# Patient Record
Sex: Female | Born: 1965
Health system: Southern US, Community
[De-identification: ages and names within clinical notes are randomized; demographics above are authoritative.]

## PROBLEM LIST (undated history)

## (undated) DIAGNOSIS — H409 Unspecified glaucoma: Secondary | ICD-10-CM

## (undated) DIAGNOSIS — E119 Type 2 diabetes mellitus without complications: Secondary | ICD-10-CM

## (undated) DIAGNOSIS — I10 Essential (primary) hypertension: Secondary | ICD-10-CM

## (undated) DIAGNOSIS — R7302 Impaired glucose tolerance (oral): Secondary | ICD-10-CM

## (undated) DIAGNOSIS — M1990 Unspecified osteoarthritis, unspecified site: Secondary | ICD-10-CM

## (undated) DIAGNOSIS — E669 Obesity, unspecified: Secondary | ICD-10-CM

## (undated) HISTORY — DX: Impaired glucose tolerance (oral): R73.02

## (undated) HISTORY — DX: Obesity, unspecified: E66.9

## (undated) HISTORY — DX: Essential (primary) hypertension: I10

## (undated) HISTORY — DX: Unspecified osteoarthritis, unspecified site: M19.90

## (undated) MED FILL — Medication: Fill #0 | Status: CN

---

## 1997-11-30 ENCOUNTER — Encounter: Admission: RE | Admit: 1997-11-30 | Discharge: 1997-11-30 | Payer: Self-pay | Admitting: Sports Medicine

## 1997-12-28 ENCOUNTER — Encounter: Admission: RE | Admit: 1997-12-28 | Discharge: 1997-12-28 | Payer: Self-pay | Admitting: Sports Medicine

## 1998-03-09 ENCOUNTER — Encounter: Admission: RE | Admit: 1998-03-09 | Discharge: 1998-03-09 | Payer: Self-pay | Admitting: Family Medicine

## 1998-03-15 ENCOUNTER — Encounter: Admission: RE | Admit: 1998-03-15 | Discharge: 1998-03-15 | Payer: Self-pay | Admitting: Sports Medicine

## 1998-03-29 ENCOUNTER — Encounter: Admission: RE | Admit: 1998-03-29 | Discharge: 1998-03-29 | Payer: Self-pay | Admitting: Family Medicine

## 1998-05-06 ENCOUNTER — Encounter: Admission: RE | Admit: 1998-05-06 | Discharge: 1998-05-06 | Payer: Self-pay | Admitting: Family Medicine

## 1998-06-06 ENCOUNTER — Encounter: Admission: RE | Admit: 1998-06-06 | Discharge: 1998-06-06 | Payer: Self-pay | Admitting: Family Medicine

## 1998-06-08 ENCOUNTER — Encounter: Admission: RE | Admit: 1998-06-08 | Discharge: 1998-06-08 | Payer: Self-pay | Admitting: Family Medicine

## 1998-07-06 ENCOUNTER — Encounter: Admission: RE | Admit: 1998-07-06 | Discharge: 1998-07-06 | Payer: Self-pay | Admitting: Family Medicine

## 1998-08-23 ENCOUNTER — Encounter: Admission: RE | Admit: 1998-08-23 | Discharge: 1998-08-23 | Payer: Self-pay | Admitting: Family Medicine

## 1998-09-22 ENCOUNTER — Encounter: Admission: RE | Admit: 1998-09-22 | Discharge: 1998-09-22 | Payer: Self-pay | Admitting: Family Medicine

## 1998-09-26 ENCOUNTER — Encounter: Admission: RE | Admit: 1998-09-26 | Discharge: 1998-09-26 | Payer: Self-pay | Admitting: Family Medicine

## 1999-03-16 ENCOUNTER — Encounter: Admission: RE | Admit: 1999-03-16 | Discharge: 1999-03-16 | Payer: Self-pay | Admitting: Family Medicine

## 1999-07-07 ENCOUNTER — Encounter: Admission: RE | Admit: 1999-07-07 | Discharge: 1999-07-07 | Payer: Self-pay | Admitting: Family Medicine

## 2000-01-17 ENCOUNTER — Encounter: Admission: RE | Admit: 2000-01-17 | Discharge: 2000-01-17 | Payer: Self-pay | Admitting: Family Medicine

## 2000-07-30 ENCOUNTER — Encounter: Admission: RE | Admit: 2000-07-30 | Discharge: 2000-07-30 | Payer: Self-pay | Admitting: Family Medicine

## 2001-03-19 ENCOUNTER — Encounter: Admission: RE | Admit: 2001-03-19 | Discharge: 2001-03-19 | Payer: Self-pay | Admitting: Family Medicine

## 2001-09-04 ENCOUNTER — Encounter: Admission: RE | Admit: 2001-09-04 | Discharge: 2001-09-04 | Payer: Self-pay | Admitting: Family Medicine

## 2002-01-16 ENCOUNTER — Encounter: Admission: RE | Admit: 2002-01-16 | Discharge: 2002-01-16 | Payer: Self-pay | Admitting: Family Medicine

## 2002-04-06 ENCOUNTER — Encounter: Admission: RE | Admit: 2002-04-06 | Discharge: 2002-04-06 | Payer: Self-pay | Admitting: Occupational Medicine

## 2002-04-06 ENCOUNTER — Encounter: Payer: Self-pay | Admitting: Occupational Medicine

## 2002-05-06 ENCOUNTER — Encounter: Admission: RE | Admit: 2002-05-06 | Discharge: 2002-08-04 | Payer: Self-pay | Admitting: Orthopedic Surgery

## 2002-07-01 ENCOUNTER — Encounter: Admission: RE | Admit: 2002-07-01 | Discharge: 2002-07-01 | Payer: Self-pay | Admitting: Family Medicine

## 2003-06-11 ENCOUNTER — Encounter: Admission: RE | Admit: 2003-06-11 | Discharge: 2003-06-11 | Payer: Self-pay | Admitting: Family Medicine

## 2004-08-24 ENCOUNTER — Ambulatory Visit: Payer: Self-pay | Admitting: Family Medicine

## 2005-01-29 ENCOUNTER — Encounter (INDEPENDENT_AMBULATORY_CARE_PROVIDER_SITE_OTHER): Payer: Self-pay | Admitting: *Deleted

## 2005-01-29 LAB — CONVERTED CEMR LAB

## 2005-02-13 ENCOUNTER — Ambulatory Visit: Payer: Self-pay | Admitting: Family Medicine

## 2005-09-05 ENCOUNTER — Emergency Department (HOSPITAL_COMMUNITY): Admission: EM | Admit: 2005-09-05 | Discharge: 2005-09-05 | Payer: Self-pay | Admitting: Family Medicine

## 2006-03-04 ENCOUNTER — Encounter (INDEPENDENT_AMBULATORY_CARE_PROVIDER_SITE_OTHER): Payer: Self-pay | Admitting: Family Medicine

## 2006-03-04 ENCOUNTER — Ambulatory Visit: Payer: Self-pay | Admitting: Sports Medicine

## 2006-03-04 LAB — CONVERTED CEMR LAB
BUN: 14 mg/dL (ref 6–23)
Creatinine, Ser: 0.91 mg/dL (ref 0.40–1.20)
Direct LDL: 100 mg/dL — ABNORMAL HIGH
Glucose, Bld: 105 mg/dL — ABNORMAL HIGH (ref 70–99)
Potassium: 3.7 meq/L (ref 3.5–5.3)

## 2006-03-28 DIAGNOSIS — IMO0002 Reserved for concepts with insufficient information to code with codable children: Secondary | ICD-10-CM | POA: Insufficient documentation

## 2006-03-28 DIAGNOSIS — I10 Essential (primary) hypertension: Secondary | ICD-10-CM | POA: Insufficient documentation

## 2006-03-28 DIAGNOSIS — M1710 Unilateral primary osteoarthritis, unspecified knee: Secondary | ICD-10-CM

## 2006-03-28 DIAGNOSIS — H409 Unspecified glaucoma: Secondary | ICD-10-CM | POA: Insufficient documentation

## 2006-03-29 ENCOUNTER — Encounter (INDEPENDENT_AMBULATORY_CARE_PROVIDER_SITE_OTHER): Payer: Self-pay | Admitting: *Deleted

## 2006-06-20 ENCOUNTER — Emergency Department (HOSPITAL_COMMUNITY): Admission: EM | Admit: 2006-06-20 | Discharge: 2006-06-20 | Payer: Self-pay | Admitting: Emergency Medicine

## 2006-06-27 ENCOUNTER — Encounter (INDEPENDENT_AMBULATORY_CARE_PROVIDER_SITE_OTHER): Payer: Self-pay | Admitting: Family Medicine

## 2006-06-27 ENCOUNTER — Encounter: Admission: RE | Admit: 2006-06-27 | Discharge: 2006-06-27 | Payer: Self-pay | Admitting: Vascular Surgery

## 2006-08-12 ENCOUNTER — Emergency Department (HOSPITAL_COMMUNITY): Admission: EM | Admit: 2006-08-12 | Discharge: 2006-08-12 | Payer: Self-pay | Admitting: Emergency Medicine

## 2007-04-23 ENCOUNTER — Telehealth: Payer: Self-pay | Admitting: *Deleted

## 2007-06-12 ENCOUNTER — Ambulatory Visit: Payer: Self-pay | Admitting: Family Medicine

## 2007-06-26 ENCOUNTER — Ambulatory Visit: Payer: Self-pay | Admitting: Family Medicine

## 2007-07-10 ENCOUNTER — Ambulatory Visit: Payer: Self-pay | Admitting: Family Medicine

## 2007-07-10 ENCOUNTER — Encounter: Admission: RE | Admit: 2007-07-10 | Discharge: 2007-07-10 | Payer: Self-pay | Admitting: Obstetrics and Gynecology

## 2007-08-04 ENCOUNTER — Ambulatory Visit: Payer: Self-pay | Admitting: Family Medicine

## 2007-08-04 DIAGNOSIS — M545 Low back pain, unspecified: Secondary | ICD-10-CM | POA: Insufficient documentation

## 2007-08-04 DIAGNOSIS — M79604 Pain in right leg: Secondary | ICD-10-CM | POA: Insufficient documentation

## 2007-08-11 ENCOUNTER — Ambulatory Visit: Payer: Self-pay | Admitting: Family Medicine

## 2007-08-11 ENCOUNTER — Encounter: Payer: Self-pay | Admitting: Family Medicine

## 2007-08-11 ENCOUNTER — Encounter: Admission: RE | Admit: 2007-08-11 | Discharge: 2007-08-11 | Payer: Self-pay | Admitting: Family Medicine

## 2007-08-11 DIAGNOSIS — M25569 Pain in unspecified knee: Secondary | ICD-10-CM | POA: Insufficient documentation

## 2007-08-11 DIAGNOSIS — R209 Unspecified disturbances of skin sensation: Secondary | ICD-10-CM | POA: Insufficient documentation

## 2007-08-11 DIAGNOSIS — M79609 Pain in unspecified limb: Secondary | ICD-10-CM | POA: Insufficient documentation

## 2007-08-11 LAB — CONVERTED CEMR LAB
ALT: 23 units/L (ref 0–35)
AST: 28 units/L (ref 0–37)
Alkaline Phosphatase: 80 units/L (ref 39–117)
BUN: 19 mg/dL (ref 6–23)
Chloride: 103 meq/L (ref 96–112)
Creatinine, Ser: 0.97 mg/dL (ref 0.40–1.20)
HCT: 42.8 % (ref 36.0–46.0)
Hemoglobin: 14.2 g/dL (ref 12.0–15.0)
MCHC: 33.2 g/dL (ref 30.0–36.0)
Platelets: 238 10*3/uL (ref 150–400)
Potassium: 3.9 meq/L (ref 3.5–5.3)
RDW: 14.1 % (ref 11.5–15.5)

## 2007-08-12 ENCOUNTER — Encounter: Payer: Self-pay | Admitting: Family Medicine

## 2007-09-26 ENCOUNTER — Telehealth: Payer: Self-pay | Admitting: *Deleted

## 2007-11-30 LAB — CONVERTED CEMR LAB
HDL: 50 mg/dL
LDL Cholesterol: 58 mg/dL

## 2007-12-09 ENCOUNTER — Telehealth: Payer: Self-pay | Admitting: *Deleted

## 2008-08-10 ENCOUNTER — Encounter: Admission: RE | Admit: 2008-08-10 | Discharge: 2008-08-10 | Payer: Self-pay | Admitting: Family Medicine

## 2008-08-11 ENCOUNTER — Ambulatory Visit: Payer: Self-pay | Admitting: Family Medicine

## 2008-08-11 DIAGNOSIS — E8881 Metabolic syndrome: Secondary | ICD-10-CM | POA: Insufficient documentation

## 2008-08-12 ENCOUNTER — Encounter: Payer: Self-pay | Admitting: Family Medicine

## 2008-08-12 ENCOUNTER — Ambulatory Visit: Payer: Self-pay | Admitting: Family Medicine

## 2008-08-12 LAB — CONVERTED CEMR LAB
Chloride: 105 meq/L (ref 96–112)
Potassium: 4.3 meq/L (ref 3.5–5.3)
Sodium: 143 meq/L (ref 135–145)

## 2008-10-26 ENCOUNTER — Encounter: Payer: Self-pay | Admitting: Family Medicine

## 2008-10-26 ENCOUNTER — Ambulatory Visit: Payer: Self-pay | Admitting: Family Medicine

## 2009-05-02 ENCOUNTER — Encounter: Payer: Self-pay | Admitting: Family Medicine

## 2009-05-02 ENCOUNTER — Ambulatory Visit: Payer: Self-pay | Admitting: Family Medicine

## 2009-05-02 LAB — CONVERTED CEMR LAB
Albumin: 3.8 g/dL (ref 3.5–5.2)
BUN: 17 mg/dL (ref 6–23)
CO2: 22 meq/L (ref 19–32)
Calcium: 9.3 mg/dL (ref 8.4–10.5)
Chloride: 102 meq/L (ref 96–112)
Glucose, Bld: 104 mg/dL — ABNORMAL HIGH (ref 70–99)
Potassium: 3.8 meq/L (ref 3.5–5.3)
Sodium: 138 meq/L (ref 135–145)
Total Protein: 7.2 g/dL (ref 6.0–8.3)

## 2009-07-28 ENCOUNTER — Ambulatory Visit: Payer: Self-pay | Admitting: Family Medicine

## 2009-10-17 ENCOUNTER — Encounter: Admission: RE | Admit: 2009-10-17 | Discharge: 2009-10-17 | Payer: Self-pay | Admitting: Sports Medicine

## 2009-11-18 ENCOUNTER — Emergency Department (HOSPITAL_COMMUNITY): Admission: EM | Admit: 2009-11-18 | Discharge: 2009-11-18 | Payer: Self-pay | Admitting: Family Medicine

## 2009-11-20 ENCOUNTER — Emergency Department (HOSPITAL_COMMUNITY): Admission: EM | Admit: 2009-11-20 | Discharge: 2009-11-20 | Payer: Self-pay | Admitting: Family Medicine

## 2009-11-23 ENCOUNTER — Encounter: Payer: Self-pay | Admitting: Family Medicine

## 2009-11-23 ENCOUNTER — Ambulatory Visit: Payer: Self-pay | Admitting: Family Medicine

## 2009-11-24 LAB — CONVERTED CEMR LAB
BUN: 25 mg/dL — ABNORMAL HIGH (ref 6–23)
Potassium: 4 meq/L (ref 3.5–5.3)
Sodium: 140 meq/L (ref 135–145)

## 2009-11-30 ENCOUNTER — Ambulatory Visit: Payer: Self-pay | Admitting: Sports Medicine

## 2009-11-30 ENCOUNTER — Encounter (INDEPENDENT_AMBULATORY_CARE_PROVIDER_SITE_OTHER): Payer: Self-pay | Admitting: *Deleted

## 2009-12-07 ENCOUNTER — Encounter
Admission: RE | Admit: 2009-12-07 | Discharge: 2010-01-18 | Payer: Self-pay | Source: Home / Self Care | Attending: Family Medicine | Admitting: Family Medicine

## 2009-12-09 ENCOUNTER — Encounter: Payer: Self-pay | Admitting: Family Medicine

## 2009-12-12 ENCOUNTER — Encounter (INDEPENDENT_AMBULATORY_CARE_PROVIDER_SITE_OTHER): Payer: Self-pay | Admitting: *Deleted

## 2010-02-20 ENCOUNTER — Encounter: Payer: Self-pay | Admitting: Family Medicine

## 2010-02-28 NOTE — Assessment & Plan Note (Signed)
Summary: F/U PINCHED NERVE PAIN & BP/[facility]   Vital Signs:  Patient profile:   45 year old female Height:      68.5 inches Weight:      327 pounds Temp:     97.9 degrees F oral Pulse rate:   71 / minute BP sitting:   152 / 91  (left arm) Cuff size:   large  Vitals Entered By: Jimmy Footman, CMA (November 23, 2009 3:18 PM)  Serial Vital Signs/Assessments:  Time      Position  BP       Pulse  Resp  Temp     By                     142/94                         Lloyd Huger MD  CC: f/u pinched nerve/ bp Is Patient Diabetic? Yes Pain Assessment Patient in pain? yes       9  Primary Provider:  Barnabas Lister  MD  CC:  f/u pinched nerve/ bp.  History of Present Illness: HTN: Patient presents due to hypertension measured at 180/110 today at her employee health assessment. She has had high blood pressure since an acute on chronic worsening of her back pain. Measurement taken at UC 5 days ago was 179/99. Home readings are 169/92, 145/84 (sunday with improvement in pain). Denies HA, vision changes, chest pain, exertional dyspnea.   Back Pain: Seen at [facility] for radiculopathy and treated with steroid taper, back injection?, and has follow up scheduled with sports med next tuesday. Her pain was previously 10/10 prior to urgent care visit, and then started improving 3 days ago with the pain regimen (hydrocodone/apap, diclofenac, prednisone, amitryptilene, flexeril). Her pain is currently 1/10.   Preventive Screening-Counseling & Management  Alcohol-Tobacco     Smoking Status: never ` Allergies: No Known Drug Allergies  Past History:  Past Medical History: Last updated: 08/11/2007 creatinine 0.9 (01/2005), 0.9 (03/2006), GYN:  G0, no hx abn paps, Hgb, TSH WNL 5/05, Trich: 05/2003, 01/2005 Lumbar DJD L4-L5, L5-S1 Bilateral knee tricompartmental DJD  Family History: Last updated: 04/12/2006 Father - DM, died of a brain aneursym, MGM - stroke, Mother - HTN, glaucoma, cataracts, no  h/o Ca, CAD, PGM -DM  Social History: Last updated: 2006/04/12 neg tob, EtOH, drugs;; is working to lose weight with diet and exercise;; Married to recovering alcoholic, whom has chronic seizures;; works full time in [employer] and part-time at [employer].  Risk Factors: Alcohol Use: 0 (06/12/2007) Caffeine Use: 0 (06/12/2007) Exercise: no (10/26/2008)  Risk Factors: Smoking Status: never (11/23/2009) Passive Smoke Exposure: yes (06/12/2007)  Review of Systems  The patient denies anorexia, fever, weight loss, weight gain, vision loss, chest pain, syncope, dyspnea on exertion, peripheral edema, prolonged cough, headaches, abdominal pain, muscle weakness, and difficulty walking.    Physical Exam  General:  Well-developed,well-nourished,in no acute distress; alert,appropriate and cooperative throughout examination Head:  Normocephalic and atraumatic without obvious abnormalities. No apparent alopecia or balding. Eyes:  No corneal or conjunctival inflammation noted. EOMI. Perrla. Funduscopic exam benign, without hemorrhages, exudates or papilledema. Vision grossly normal. Mouth:  Oral mucosa and oropharynx without lesions or exudates.  Teeth in good repair. Lungs:  Normal respiratory effort, chest expands symmetrically. Lungs are clear to auscultation, no crackles or wheezes. Heart:  Normal rate and regular rhythm. S1 and S2 normal without gallop, murmur, click,  rub or other extra sounds. Msk:  No deformity or scoliosis noted of thoracic or lumbar spine.   Extremities:  No clubbing, cyanosis, edema, or deformity noted with normal full range of motion of all joints.   Neurologic:  alert & oriented X3, cranial nerves II-XII intact, strength normal in all extremities, and sensation intact to light touch.     Impression & Recommendations:  Problem # 1:  HYPERTENSION, BENIGN SYSTEMIC (ICD-401.1) Acute on chronic hypertension likely secondary to severe back pain. Likely somewhat elevated due  to automated machine taking her BP with inappropriate sized cuff. Measured in office with manual cuff to be 142/94 on repeat. Will not add any new medications today and defer to PCP for chronic managment. Checked labs today and asked pt to record daily BP to show Dr. Tye Savoy when she follows up in one month.   Her updated medication list for this problem includes:    Toprol Xl 200 Mg Xr24h-tab (Metoprolol succinate) ..... One tab by mouth daily    Lisinopril-hydrochlorothiazide 20-25 Mg Tabs (Lisinopril-hydrochlorothiazide) ..... One tab by mouth daily  Orders: Basic Met-[facility] ([address]-60454) [facility]- Est Level  3 (09811)  Problem # 2:  LEG PAIN, RIGHT (ICD-729.5)  Pain is controlled with urgent care physician's pain managment. She states amitryptilene has been very beneficial in the past, and this may help with her chronic pain. Also discussed importance of continued weight loss.   Orders: [facility]- Est Level  3 (91478)  Problem # 3:  OBESITY, MORBID (ICD-278.01) Has lost 7 pounds since last visit. Encouraged patient to continue trying.   Complete Medication List: 1)  Toprol Xl 200 Mg Xr24h-tab (Metoprolol succinate) .... One tab by mouth daily 2)  Lumigan 0.03 % Soln (Bimatoprost) 3)  Amitriptyline Hcl 75 Mg Tabs (Amitriptyline hcl) .... One daily 4)  Lisinopril-hydrochlorothiazide 20-25 Mg Tabs (Lisinopril-hydrochlorothiazide) .... One tab by mouth daily 5)  Black Cohosh 540 Mg Caps (Black cohosh) 6)  Calcium 500-125 Mg-unit Tabs (Calcium carbonate-vitamin d) 7)  Multivitamins Caps (Multiple vitamin)  Patient Instructions: 1)  Please schedule appointment with Dr. Tye Savoy to talk about your blood pressure. 2)  Take your BP every day and write down results to show Dr. Tye Savoy. 3)  Good job on weight loss. Keep up the good work!   Orders Added: 1)  Basic Met-[facility] [29562-13086] 2)  [facility]- Est Level  3 [57846]

## 2010-02-28 NOTE — Assessment & Plan Note (Signed)
Summary: f/up,tcb   Vital Signs:  Patient profile:   45 year old female Height:      68.5 inches Weight:      332 pounds BMI:     49.93 Temp:     98.3 degrees F BP sitting:   147 / 83  (left arm)  Vitals Entered By: Theresia Lo RN (May 02, 2009 8:32 AM)  Nutrition Counseling: Patient's BMI is greater than 25 and therefore counseled on weight management options. CC: BP was up at work on Friday Is Patient Diabetic? No Pain Assessment Patient in pain? no        Primary Care Provider:  Lequita Asal  MD  CC:  BP was up at work on Friday.  History of Present Illness: 45 y/o morbidly obese female here for f/u HTN  HTN- BPs at work 160s/100s upon checking at work. denies chest pain, SOB, edema, palpitations, headaches. on toprol and lisinopril/hctz  impaired glucose tolerance- A1C 6.1 last July. denies polyuria, polydipsia. +Family H/o DM.  Habits & Providers  Alcohol-Tobacco-Diet     Tobacco Status: never  Current Medications (verified): 1)  Toprol Xl 200 Mg Xr24h-Tab (Metoprolol Succinate) .... One Tab By Mouth Daily 2)  Lumigan 0.03 % Soln (Bimatoprost) 3)  Amitriptyline Hcl 75 Mg  Tabs (Amitriptyline Hcl) .... One Daily 4)  Lisinopril-Hydrochlorothiazide 20-25 Mg  Tabs (Lisinopril-Hydrochlorothiazide) .... One Tab By Mouth Daily 5)  Black Cohosh 540 Mg Caps (Black Cohosh) 6)  Calcium 500-125 Mg-Unit Tabs (Calcium Carbonate-Vitamin D) 7)  Multivitamins  Caps (Multiple Vitamin)  Allergies (verified): No Known Drug Allergies  Past History:  Family history reviewed for relevance to current acute and chronic problems.  Family History: Reviewed history from 03/28/2006 and no changes required. Father - DM, died of a brain aneursym, MGM - stroke, Mother - HTN, glaucoma, cataracts, no h/o Ca, CAD, PGM -DM  Physical Exam  General:  alert, morbidly obese. vitals reviewed. Mouth:  Oral mucosa and oropharynx without lesions or exudates.  Teeth in good  repair. Neck:  No deformities, masses, or tenderness noted. no carotid bruits.  Lungs:  Normal respiratory effort, chest expands symmetrically. Lungs are clear to auscultation, no crackles or wheezes. Heart:  Normal rate and regular rhythm. S1 and S2 normal without gallop, murmur, click, rub or other extra sounds. Pulses:  R and L carotid,radial,dorsalis pedis and posterior tibial pulses are full and equal bilaterally Extremities:  no  LE edema bilaterally   Impression & Recommendations:  Problem # 1:  HYPERTENSION, BENIGN SYSTEMIC (ICD-401.1) Assessment Unchanged BP near goal in office today. patient counseled to avoid foods high in sodium. no changes for now. patient to monitor BP at work qweek x1 month.   Her updated medication list for this problem includes:    Toprol Xl 200 Mg Xr24h-tab (Metoprolol succinate) ..... One tab by mouth daily    Lisinopril-hydrochlorothiazide 20-25 Mg Tabs (Lisinopril-hydrochlorothiazide) ..... One tab by mouth daily  Orders: Comp Met-[facility] ([address]109-60454) [facility]- Est Level  3 (09811)  Problem # 2:  IMPAIRED GLUCOSE TOLERANCE (ICD-271.3) Assessment: Unchanged check A1C.   Orders: A1C-[facility] (91478) [facility]- Est Level  3 (29562)   Prevention & Chronic Care Immunizations   Influenza vaccine: Not documented   Influenza vaccine deferral: Not available  (08/11/2008)    Tetanus booster: 12/30/1998: Done.   Tetanus booster due: 12/29/2008    Pneumococcal vaccine: Not documented   Pneumococcal vaccine deferral: Not indicated  (08/11/2008)  Other Screening   Pap smear: NEGATIVE FOR INTRAEPITHELIAL  LESIONS OR MALIGNANCY.  (10/26/2008)   Pap smear action/deferral: Ordered  (10/26/2008)   Pap smear due: 01/29/2006    Mammogram: ASSESSMENT: Negative - BI-RADS 1^MM DIGITAL SCREENING  (08/10/2008)   Mammogram action/deferral: Screening mammogram in 1 year.     (06/27/2006)   Mammogram due: 08/10/2009   Smoking status: never  (05/02/2009)  Lipids   Total  Cholesterol: Not documented   LDL: 58  (11/30/2007)   LDL Direct: 100  (03/04/2006)   HDL: 50  (11/30/2007)   Triglycerides: 87  (11/30/2007)  Hypertension   Last Blood Pressure: 147 / 83  (05/02/2009)   Serum creatinine: 0.91  (08/12/2008)   Serum potassium 4.3  (08/12/2008) CMP ordered     Hypertension flowsheet reviewed?: Yes   Progress toward BP goal: Improved  Self-Management Support :   Personal Goals (by the next clinic visit) :      Personal blood pressure goal: 140/90  (08/11/2008)   Patient will work on the following items until the next clinic visit to reach self-care goals:     Medications and monitoring: take my medicines every day, check my blood pressure  (05/02/2009)     Eating: eat more vegetables, eat foods that are low in salt  (05/02/2009)     Activity: take a 30 minute walk every day  (05/02/2009)    Hypertension self-management support: BP self-monitoring log, Written self-care plan, Education handout  (05/02/2009)   Hypertension self-care plan printed.   Hypertension education handout printed  Appended Document: A1c  5.9%    Lab Visit  Laboratory Results   Blood Tests   Date/Time Received: May 02, 2009 9:15 AM  Date/Time Reported: May 02, 2009 11:36 AM   HGBA1C: 5.9%   (Normal Range: Non-Diabetic - 3-6%   Control Diabetic - 6-8%)  Comments: ...............test performed by......Marland KitchenBonnie A. [place], MLS (ASCP)cm    Orders Today:

## 2010-02-28 NOTE — Assessment & Plan Note (Signed)
Summary: F/U VISIT/[facility]   Vital Signs:  Patient profile:   45 year old female Weight:      334.5 pounds Temp:     98.2 degrees F oral Pulse rate:   90 / minute Pulse rhythm:   regular BP sitting:   139 / 81  (left arm) Cuff size:   large  Vitals Entered By: Loralee Pacas CMA (July 28, 2009 4:03 PM)  Primary Care Provider:  Lequita Asal  MD  CC:  f/u BP.  History of Present Illness: HTN- hasnt been taking BP at work. denies chest pain, SOB, edema, palpitations, headaches. on toprol and lisinopril/hctz. has had some dietary indiscretions.  Current Medications (verified): 1)  Toprol Xl 200 Mg Xr24h-Tab (Metoprolol Succinate) .... One Tab By Mouth Daily 2)  Lumigan 0.03 % Soln (Bimatoprost) 3)  Amitriptyline Hcl 75 Mg  Tabs (Amitriptyline Hcl) .... One Daily 4)  Lisinopril-Hydrochlorothiazide 20-25 Mg  Tabs (Lisinopril-Hydrochlorothiazide) .... One Tab By Mouth Daily 5)  Black Cohosh 540 Mg Caps (Black Cohosh) 6)  Calcium 500-125 Mg-Unit Tabs (Calcium Carbonate-Vitamin D) 7)  Multivitamins  Caps (Multiple Vitamin)  Allergies (verified): No Known Drug Allergies  Physical Exam  General:  alert, morbidly obese. vitals reviewed. Mouth:  MMM   Impression & Recommendations:  Problem # 1:  HYPERTENSION, BENIGN SYSTEMIC (ICD-401.1) Assessment Unchanged  no changes. f/u in 6 months.   Her updated medication list for this problem includes:    Toprol Xl 200 Mg Xr24h-tab (Metoprolol succinate) ..... One tab by mouth daily    Lisinopril-hydrochlorothiazide 20-25 Mg Tabs (Lisinopril-hydrochlorothiazide) ..... One tab by mouth daily  Orders: [facility]- Est Level  2 (32992)  Patient Instructions: 1)  follow up in 6 months with Dr. Tye Savoy Prescriptions: LISINOPRIL-HYDROCHLOROTHIAZIDE 20-25 MG  TABS (LISINOPRIL-HYDROCHLOROTHIAZIDE) one tab by mouth daily  #90 Tablet x 4   Entered and Authorized by:   Lequita Asal  MD   Signed by:   Lequita Asal  MD on 07/28/2009  Method used:   Electronically to        [facility]* (retail)       [address]       [address] [place] [address]       [place], [place]  [address]       Ph: [phone]       Fax: [phone]   RxID:   [id number] TOPROL XL 200 MG XR24H-TAB (METOPROLOL SUCCINATE) one tab by mouth daily  #90 x 4   Entered and Authorized by:   Lequita Asal  MD   Signed by:   Lequita Asal  MD on 07/28/2009   Method used:   Electronically to        [facility]* (retail)       [address]       [address] [place] [address]mail[address]       [place], [place]  [address]       Ph: [phone]       Fax: [phone]   RxID:   [id number]

## 2010-02-28 NOTE — Letter (Signed)
Summary: [facility] PT Referral form  [facility] PT Referral form   Imported By: Marily Memos 11/30/2009 15:44:32  _____________________________________________________________________  External Attachment:    Type:   Image     Comment:   External Document

## 2010-02-28 NOTE — Miscellaneous (Signed)
  Clinical Lists Changes  Problems: Removed problem of PERIMENOPAUSAL SYNDROME (ICD-627.9)

## 2010-02-28 NOTE — Assessment & Plan Note (Signed)
Summary: NP,PINCHED NERVE LEG,HIP PAIN,[facility]   Vital Signs:  Patient profile:   45 year old female Height:      68.5 inches Weight:      327 pounds BP sitting:   137 / 90  (left arm)  Vitals Entered By: Rochele Pages RN (November 30, 2009 2:33 PM) CC: rt lower back pain, radiating to shin   Primary Provider:  Barnabas Lister  MD  CC:  rt lower back pain and radiating to shin.  History of Present Illness: 45 yo F here for shin, thigh, and some low back pain x 2 weeks.  Had similar pain in lateral ankle with associated paresthesias 2 years ago that grdaully got better, was told it was a pinched nerve. 2 weeks ago was placing some ice up on a shelf and thinks that triggered things. Shin pain described as numb, hot/cold feeling, and some pins and needles.  Better with walking, in seated position is actually worse.  Does get occasional pain in Rt low back.  Has some radiation from thigh down into her front of her shin. Thinks sometimes her leg gives out. Occasionally feels grinding in her hip. Has tried flexeril, vicodin, pred burst, and 1 amitriptyline 75 mg (which is 45 years old leftover from prior ankle issue).  Also toradol shot.  Given diclofenac which helped the most so far. Has been seen in [facility] clinic and urgent care. Nl B/B function. No saddle anesthesia. States bending back and touching her toes are ok. Has tried exercise bike and stretching, but still with pain. Has pain at night, difficulty laying down for bed. Has normal motor function. 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  Allergies: No Known Drug Allergies  Physical Exam  General:  overweight-appearing.   Msk:  Back: no midline ttp, no paraspinal spasm, no SI ttp.  Neg SLR and FABER b/l.  No pain with back ext, stork, or forward flexion. Hip: FROM, + ttp over hip flexor and upper thigh.  + pain with resisted hip flexionNeg ttp over greater troch Knee: FROM, no eff or crep Lower leg: mild ttp lateral  aspect, SILT Neuro: 1+ patellar and ankle DTRs symmetrical, nl LE strength diffusely b/l   Impression & Recommendations:  Problem # 1:  LEG PAIN, RIGHT (ICD-729.5) I think her lower leg pain is actually lumar radiculopathy, and hip/thigh pain may be hip flexor tendinitis vs hip OA. - upon record review, she has had this pain intermittently for last 2 years - in addition, she has had b/l knee xrays which do show some OA, which could also be contributing. - diclofenac refilled since decreased pain the most - PT referral for hip/thigh for [place], ionto, and phono as well as stretches - f/u 3-4 weeks.  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  Xrays reviewed which show facet arthritis in L4-L5-S1 region and mild listhesis at L5-S1 - diclofenac and amitriptyline refilled - PT referral to concentrate on McKenzie exercises - f/u 3-4 weeks, if no improvement may consider lumbar MRI, but less concerning since she has had this intermittently for so long with normal neurologic function  I spent > 25 minutes with patient, over 50% spent reviewing films and on counseling regarding diagnosis, prognosis, and treatment  Her updated medication list for this problem includes:  Diclofenac Sodium 75 Mg Tbec (Diclofenac sodium) .Marland Kitchen... 1 by mouth two times a day with food  Complete Medication List: 1)  Toprol Xl 200 Mg Xr24h-tab (Metoprolol succinate) .... One tab by mouth daily 2)  Lumigan 0.03 % Soln (Bimatoprost) 3)  Lisinopril-hydrochlorothiazide 20-25 Mg Tabs (Lisinopril-hydrochlorothiazide) .... One tab by mouth daily 4)  Black Cohosh 540 Mg Caps (Black cohosh) 5)  Calcium 500-125 Mg-unit Tabs (Calcium carbonate-vitamin d) 6)  Multivitamins Caps (Multiple vitamin) 7)  Amitriptyline Hcl 50 Mg Tabs (Amitriptyline hcl) ....  Take 1 tab by mouth at bedtime 8)  Diclofenac Sodium 75 Mg Tbec (Diclofenac sodium) .Marland Kitchen.. 1 by mouth two times a day with food Prescriptions: DICLOFENAC SODIUM 75 MG TBEC (DICLOFENAC SODIUM) 1 by mouth two times a day with food  #60 x 0   Entered by:   Lillia Pauls CMA   Authorized by:   Corbin Ade MD   Signed by:   Lillia Pauls CMA on 11/30/2009   Method used:   Electronically to        [facility]* (retail)       [address]       [address] Shipping/mailing       [place], [place]  [address]       Ph: [phone]       Fax: [phone]   RxID:   [id number] AMITRIPTYLINE HCL 50 MG TABS (AMITRIPTYLINE HCL) Take 1 tab by mouth at bedtime  #30 x 1   Entered by:   Lillia Pauls CMA   Authorized by:   Corbin Ade MD   Signed by:   Lillia Pauls CMA on 11/30/2009   Method used:   Electronically to        [facility]* (retail)       [address]       [address] Shipping/mailing       [place], [place]  [address]       Ph: [phone]       Fax: [phone]   RxID:   [id number]    Orders Added: 1)  Est. Patient Level IV [73220]

## 2010-02-28 NOTE — Miscellaneous (Signed)
Summary: Rehab Report  Rehab Report   Imported By: Marily Memos 12/19/2009 08:50:13  _____________________________________________________________________  External Attachment:    Type:   Image     Comment:   External Document

## 2010-08-14 ENCOUNTER — Ambulatory Visit (INDEPENDENT_AMBULATORY_CARE_PROVIDER_SITE_OTHER): Payer: 59 | Admitting: Family Medicine

## 2010-08-14 ENCOUNTER — Encounter: Payer: Self-pay | Admitting: Family Medicine

## 2010-08-14 DIAGNOSIS — IMO0002 Reserved for concepts with insufficient information to code with codable children: Secondary | ICD-10-CM

## 2010-08-14 DIAGNOSIS — E739 Lactose intolerance, unspecified: Secondary | ICD-10-CM

## 2010-08-14 DIAGNOSIS — E8881 Metabolic syndrome: Secondary | ICD-10-CM

## 2010-08-14 DIAGNOSIS — I10 Essential (primary) hypertension: Secondary | ICD-10-CM

## 2010-08-14 LAB — POCT GLYCOSYLATED HEMOGLOBIN (HGB A1C): Hemoglobin A1C: 6.2

## 2010-08-14 LAB — LDL CHOLESTEROL, DIRECT: Direct LDL: 114 mg/dL — ABNORMAL HIGH

## 2010-08-14 MED ORDER — LISINOPRIL-HYDROCHLOROTHIAZIDE 20-25 MG PO TABS: 1.0000 | ORAL_TABLET | Freq: Every day | ORAL | Status: DC

## 2010-08-14 MED ORDER — AMLODIPINE BESYLATE 10 MG PO TABS: 5.0000 mg | ORAL_TABLET | Freq: Every day | ORAL | Status: DC

## 2010-08-14 MED ORDER — METOPROLOL SUCCINATE ER 200 MG PO TB24: 200.0000 mg | ORAL_TABLET | Freq: Every day | ORAL | Status: DC

## 2010-08-14 NOTE — Patient Instructions (Addendum)
Nice to meet you. I will call you if your lab tests abnormal. I will refill your medications. Please start norvasc 5mg  daily. If ok, increase to 10mg  daily after 3 days. Call for physical exam appointment.  Hypertension (High Blood Pressure) As your heart beats, it forces blood through your arteries. This force is your blood pressure. If the pressure is too high, it is called hypertension (HTN) or high blood pressure. HTN is dangerous because you may have it and not know it. High blood pressure may mean that your heart has to work harder to pump blood. Your arteries may be narrow or stiff. The extra work puts you at risk for heart disease, stroke, and other problems.  Blood pressure consists of two numbers, a higher number over a lower, 110/72, for example. It is stated as "110 over 72." The ideal is below 120 for the top number (systolic) and under 80 for the bottom (diastolic). Write down your blood pressure today. You should pay close attention to your blood pressure if you have certain conditions such as:  Heart failure.  Prior heart attack.   Diabetes   Chronic kidney disease.   Prior stroke.   Multiple risk factors for heart disease.   To see if you have HTN, your blood pressure should be measured while you are seated with your arm held at the level of the heart. It should be measured at least twice. A one-time elevated blood pressure reading (especially in the Emergency Department) does not mean that you need treatment. There may be conditions in which the blood pressure is different between your right and left arms. It is important to see your caregiver soon for a recheck. Most people have essential hypertension which means that there is not a specific cause. This type of high blood pressure may be lowered by changing lifestyle factors such as:  Stress.  Smoking.   Lack of exercise.   Excessive weight.  Drug/tobacco/alcohol use.   Eating less salt.   Most people do not have  symptoms from high blood pressure until it has caused damage to the body. Effective treatment can often prevent, delay or reduce that damage. TREATMENT Treatment for high blood pressure, when a cause has been identified, is directed at the cause. There are a large number of medications to treat HTN. These fall into several categories, and your caregiver will help you select the medicines that are best for you. Medications may have side effects. You should review side effects with your caregiver. If your blood pressure stays high after you have made lifestyle changes or started on medicines,   Your medication(s) may need to be changed.   Other problems may need to be addressed.   Be certain you understand your prescriptions, and know how and when to take your medicine.   Be sure to follow up with your caregiver within the time frame advised (usually within two weeks) to have your blood pressure rechecked and to review your medications.   If you are taking more than one medicine to lower your blood pressure, make sure you know how and at what times they should be taken. Taking two medicines at the same time can result in blood pressure that is too low.  SEEK IMMEDIATE MEDICAL CARE IF YOU DEVELOP:  A severe headache, blurred or changing vision, or confusion.   Unusual weakness or numbness, or a faint feeling.   Severe chest or abdominal pain, vomiting, or breathing problems.  MAKE SURE YOU:  Understand these instructions.   Will watch your condition.   Will get help right away if you are not doing well or get worse.  Document Released: 01/15/2005 Document Re-Released: 07/05/2009 [facility] Patient Information 2011 [place], [place].

## 2010-08-15 ENCOUNTER — Encounter: Payer: Self-pay | Admitting: Family Medicine

## 2010-08-15 LAB — CBC
Hemoglobin: 14.1 g/dL (ref 12.0–15.0)
MCV: 94.6 fL (ref 78.0–100.0)
Platelets: 219 10*3/uL (ref 150–400)
RBC: 4.59 MIL/uL (ref 3.87–5.11)
WBC: 7.6 10*3/uL (ref 4.0–10.5)

## 2010-08-15 LAB — BASIC METABOLIC PANEL
BUN: 20 mg/dL (ref 6–23)
Potassium: 4 mEq/L (ref 3.5–5.3)
Sodium: 141 mEq/L (ref 135–145)

## 2010-08-15 LAB — TSH: TSH: 1.388 u[IU]/mL (ref 0.350–4.500)

## 2010-08-15 NOTE — Assessment & Plan Note (Addendum)
Deteriorated. Patient has done nutrition counseling and admits that motivation is primary obstacle currently. Discussed health as motivation, mainly in regards to improving back pain and decreasing number of medications she takes for hypertension.

## 2010-08-15 NOTE — Progress Notes (Signed)
  Subjective:    Patient ID: Misty Maxwell, female    DOB: Apr 01, 1965, 45 y.o.   MRN: [id number]  HPI 1. HTN. Takes BP at home and this week usually 130s/70s. Highest values were during acute pain after diagnosed with radiculopathy in low back ranging sys 170s. Needs med refills today. Denies any syncope, dizzyness. Has gained 10 lb in past few months due to not exercising and eating freely.   2. Radiculopathy. Had significant pain from low back to right thigh, now improved. Still causes intermittent pain and limits her exercise capability at times, but knows she cannot let this limit her activity level in the long term. Denies bowel/bladder incontinence, difficulty walking, weakness. Pt verbalizes that weight worsens this pain.  Review of Systems See HPI, otherwise negative.    Objective:   Physical Exam  Vitals reviewed. Constitutional: She is oriented to person, place, and time. She appears well-developed and well-nourished. No distress.  HENT:  Head: Normocephalic and atraumatic.  Mouth/Throat: Oropharynx is clear and moist.  Eyes: EOM are normal. Pupils are equal, round, and reactive to light.  Cardiovascular: Normal rate and regular rhythm.   No murmur heard.      Distant heart sounds.  Pulmonary/Chest: Effort normal and breath sounds normal. No respiratory distress. She has no wheezes.  Musculoskeletal: Normal range of motion. She exhibits no tenderness.       5/5 distal LE strength symmetric. Trace BLE edema.   Neurological: She is alert and oriented to person, place, and time. Coordination normal.  Psychiatric: She has a normal mood and affect.          Assessment & Plan:

## 2010-08-15 NOTE — Assessment & Plan Note (Signed)
Last A1c 6.1 years ago. With deteriorated obesity, will recheck A1c today. Discussed lifestyle modifications: weight loss and physical activity today.

## 2010-08-15 NOTE — Assessment & Plan Note (Addendum)
Check LDL, A1C to evaluate risk factors and possible need for additional therapy. Goal LDL <130.

## 2010-08-15 NOTE — Assessment & Plan Note (Addendum)
Pain manageable and stable currently. No red flag symptoms. Will hold on refilling flexeril as patient does not feel strongly regarding benefit. Not taking elavil on med list and doesn't know what this medication is for. Will DC and restart if pain becomes more of an issue. PATP.

## 2010-08-15 NOTE — Assessment & Plan Note (Signed)
Pt endorses better values at home, but question accuracy of cuff given her arm size/shape. Consistently >140/90 in clinic. Will start low dose norvasc and titrate to 10mg  if tolerated. F/u in one month. Continue lisinopril/HCTZ and metoprolol at current doses. Labs today.

## 2010-09-21 ENCOUNTER — Other Ambulatory Visit: Payer: Self-pay | Admitting: Family Medicine

## 2010-09-21 DIAGNOSIS — Z1231 Encounter for screening mammogram for malignant neoplasm of breast: Secondary | ICD-10-CM

## 2010-10-19 ENCOUNTER — Ambulatory Visit: Payer: 59

## 2010-10-26 ENCOUNTER — Ambulatory Visit
Admission: RE | Admit: 2010-10-26 | Discharge: 2010-10-26 | Disposition: A | Discharge status: Home / Self Care | Payer: 59 | Source: Ambulatory Visit | Attending: Family Medicine | Admitting: Family Medicine

## 2010-10-26 DIAGNOSIS — Z1231 Encounter for screening mammogram for malignant neoplasm of breast: Secondary | ICD-10-CM

## 2010-10-27 ENCOUNTER — Telehealth: Payer: Self-pay | Admitting: Family Medicine

## 2010-10-27 NOTE — Telephone Encounter (Signed)
States she is having swelling in feet and legs because of the amlodipine.  She is going to come today to have BP checked.

## 2010-11-17 ENCOUNTER — Ambulatory Visit (INDEPENDENT_AMBULATORY_CARE_PROVIDER_SITE_OTHER): Payer: 59 | Admitting: *Deleted

## 2010-11-17 ENCOUNTER — Ambulatory Visit (INDEPENDENT_AMBULATORY_CARE_PROVIDER_SITE_OTHER): Payer: 59 | Admitting: Family Medicine

## 2010-11-17 ENCOUNTER — Ambulatory Visit (HOSPITAL_COMMUNITY)
Admission: RE | Admit: 2010-11-17 | Discharge: 2010-11-17 | Disposition: A | Discharge status: Home / Self Care | Payer: 59 | Source: Ambulatory Visit | Attending: Family Medicine | Admitting: Family Medicine

## 2010-11-17 ENCOUNTER — Encounter: Payer: Self-pay | Admitting: Family Medicine

## 2010-11-17 VITALS — BP 150/96 | Temp 98.3°F | Ht 68.0 in | Wt 345.5 lb

## 2010-11-17 VITALS — BP 148/94 | HR 84 | Temp 98.3°F | Ht 68.0 in | Wt 345.5 lb

## 2010-11-17 DIAGNOSIS — M79609 Pain in unspecified limb: Secondary | ICD-10-CM

## 2010-11-17 DIAGNOSIS — R609 Edema, unspecified: Secondary | ICD-10-CM

## 2010-11-17 DIAGNOSIS — M7989 Other specified soft tissue disorders: Secondary | ICD-10-CM | POA: Insufficient documentation

## 2010-11-17 DIAGNOSIS — R600 Localized edema: Secondary | ICD-10-CM

## 2010-11-17 DIAGNOSIS — I10 Essential (primary) hypertension: Secondary | ICD-10-CM

## 2010-11-17 NOTE — Progress Notes (Signed)
  Subjective:    Patient ID: Misty Maxwell, female    DOB: [birth year], 45 y.o.   MRN: [id number]  HPI Patient noticed swelling in right lower extremity distal to the knee several weeks ago. She stopped taking Norvasc, because she read on the internet that this could cause edema. Additionally she started taking OTC pills to reduce fluid, and the name is not remembered. She props her legs at night. The problem is worsening and moving higher towards her knees. She is also concerned for weight gain and elevated BP. No history of trauma to RLE, surgery or cardiac disease.   Wt Readings from Last 3 Encounters:  11/17/10 345 lb 8 oz (156.718 kg)  11/17/10 345 lb 8 oz (156.718 kg)  08/14/10 337 lb 1.6 oz (152.908 kg)      Review of Systems Positive for minimal dyspnea on exertion Neg for orthopnea, LLE swelling,     Objective:   Physical Exam BP 148/94  Pulse 84  Temp(Src) 98.3 F (36.8 C) (Oral)  Ht 5\' 8"  (1.727 m)  Wt 345 lb 8 oz (156.718 kg)  BMI 52.53 kg/m2  LMP 09/29/2010  Gen: Alert, morbidly obese, mild discomfort CV: RRR, no murmurs or S3; no JVD Lungs: CTA - B, no rales, no dyspnea RLE: max circm 52 cm; 3+ pitting edema, general TTP, sensation intact to foot, hemosiderin deposition on ankles LLE: max circ 46 cm, 1+ pitting edema, non-tender, hemosiderin deposition on ankles         Assessment & Plan:  Misty Maxwell is a 45 year old F with morbid obesity and evidence of peripheral vascular insufficiency. However, her new onset right lower extremity edema and pain could represent a DVT. Therefore, she needs to be evaluated with LE venous duplex dopplers. If positive, then admit to inpatient FPTS for anti-coagulation. If negative, use compression stockings and elevation when possible. Follow-up for BP control.

## 2010-11-17 NOTE — Progress Notes (Signed)
Patient came in for BP check. Started on Norvasc in July and has had swelling in right leg since. Has worsened recently and  three weeks ago she decided to stop norvasc. Right leg is very swollen and painful . BP 150/96  RA and 148/94 LA. Will have Dr. Clinton Sawyer see patient now to evaluate.

## 2010-11-17 NOTE — Patient Instructions (Signed)
Mrs. Danker,   Given that your situation could represent a DVT, it is necessary for immediate evaluation. Please go to the outpatient radiology unit at [facility]. Depending on the results, we will decide what to do for treatment. Please follow-up in 2 weeks if negative.   Take Care,   Dr. Clinton Sawyer

## 2010-12-26 ENCOUNTER — Telehealth: Payer: Self-pay | Admitting: Family Medicine

## 2010-12-26 NOTE — Telephone Encounter (Signed)
Hey, I've actually never met this patient and I am not sure which results she is referring to.  Can you please ask patient for more information?  You may need to route this to Dr. Clinton Sawyer (he was the last MD to see her in October).  Thanks!

## 2010-12-26 NOTE — Telephone Encounter (Signed)
Misty Maxwell is calling to get the results from October.  She said that a message can be left at the number she left.

## 2010-12-27 NOTE — Telephone Encounter (Signed)
I left a voice message for Misty Maxwell telling her that her Lower Extremity Doppler was negative for DVT. Therefore, she does not have a blood clot, and that is why she was not admitted to the hospital on the day of the study. I instructed her to call the clinic if she has any further questions or concerns.

## 2010-12-27 NOTE — Telephone Encounter (Signed)
Margarette Canada were the last MD to see this patient. I was wondering if you know anything pertaining to this patient. ----Huntley Dec

## 2010-12-27 NOTE — Telephone Encounter (Signed)
I saw this patient on 10/20 and she was sent for an urgent LE Doppler to rule out DVT. My order was for the patient to be admitted if it was positive. She was not admitted, so I assumed that she knew it was not positive. Therefore, I never followed up. I apologize for that. I will call her.

## 2011-09-19 ENCOUNTER — Ambulatory Visit (INDEPENDENT_AMBULATORY_CARE_PROVIDER_SITE_OTHER): Payer: 59 | Admitting: *Deleted

## 2011-09-19 VITALS — BP 148/94 | HR 68 | Wt 343.0 lb

## 2011-09-19 DIAGNOSIS — I10 Essential (primary) hypertension: Secondary | ICD-10-CM

## 2011-09-19 MED ORDER — LISINOPRIL-HYDROCHLOROTHIAZIDE 20-25 MG PO TABS: 1.0000 | ORAL_TABLET | Freq: Every day | ORAL | Status: DC

## 2011-09-19 MED ORDER — METOPROLOL SUCCINATE ER 200 MG PO TB24: 200.0000 mg | ORAL_TABLET | Freq: Every day | ORAL | Status: DC

## 2011-09-19 MED ORDER — AMLODIPINE BESYLATE 10 MG PO TABS
5.0000 mg | ORAL_TABLET | Freq: Every day | ORAL | Status: DC
Start: 2011-09-19 — End: 2011-10-16

## 2011-09-19 NOTE — Progress Notes (Signed)
Patient comes to office today for BP check. BP checked manually using large adult cuff.  BP LA 148/94 and RA 132/92  Pulse 68.  Reviewed meds and patient is taking as directed.  Appointment scheduled with PCP for  10/09/2011. Patient  Will need refills on medication before then . Advised will send in a months supply .

## 2011-10-09 ENCOUNTER — Encounter: Payer: Self-pay | Admitting: Family Medicine

## 2011-10-09 ENCOUNTER — Ambulatory Visit (INDEPENDENT_AMBULATORY_CARE_PROVIDER_SITE_OTHER): Payer: 59 | Admitting: Family Medicine

## 2011-10-09 VITALS — BP 150/83 | HR 72 | Temp 98.2°F | Ht 68.0 in | Wt 346.0 lb

## 2011-10-09 DIAGNOSIS — I10 Essential (primary) hypertension: Secondary | ICD-10-CM

## 2011-10-09 NOTE — Patient Instructions (Addendum)
It was nice to meet you, Misty Maxwell. Continue your current medications for now. Increase physical activity - try walking for about 30 minutes per day. Continue to follow DASH diet. Please call Dr. Gerilyn Pilgrim to set up appointment. Follow up with me in ONE month to recheck BP.  DASH Diet The DASH diet stands for "Dietary Approaches to Stop Hypertension." It is a healthy eating plan that has been shown to reduce high blood pressure (hypertension) in as little as 14 days, while also possibly providing other significant health benefits. These other health benefits include reducing the risk of breast cancer after menopause and reducing the risk of type 2 diabetes, heart disease, colon cancer, and stroke. Health benefits also include weight loss and slowing kidney failure in patients with chronic kidney disease.  DIET GUIDELINES  Limit salt (sodium). Your diet should contain less than 1500 mg of sodium daily.   Limit refined or processed carbohydrates. Your diet should include mostly whole grains. Desserts and added sugars should be used sparingly.   Include small amounts of heart-healthy fats. These types of fats include nuts, oils, and tub margarine. Limit saturated and trans fats. These fats have been shown to be harmful in the body.  CHOOSING FOODS  The following food groups are based on a 2000 calorie diet. See your Registered Dietitian for individual calorie needs. Grains and Grain Products (6 to 8 servings daily)  Eat More Often: Whole-wheat bread, brown rice, whole-grain or wheat pasta, quinoa, popcorn without added fat or salt (air popped).   Eat Less Often: White bread, white pasta, white rice, cornbread.  Vegetables (4 to 5 servings daily)  Eat More Often: Fresh, frozen, and canned vegetables. Vegetables may be raw, steamed, roasted, or grilled with a minimal amount of fat.   Eat Less Often/Avoid: Creamed or fried vegetables. Vegetables in a cheese sauce.  Fruit (4 to 5 servings  daily)  Eat More Often: All fresh, canned (in natural juice), or frozen fruits. Dried fruits without added sugar. One hundred percent fruit juice ( cup [237 mL] daily).   Eat Less Often: Dried fruits with added sugar. Canned fruit in light or heavy syrup.  Foot Locker, Fish, and Poultry (2 servings or less daily. One serving is 3 to 4 oz [85-114 g]).  Eat More Often: Ninety percent or leaner ground beef, tenderloin, sirloin. Round cuts of beef, chicken breast, [place] breast. All fish. Grill, bake, or broil your meat. Nothing should be fried.   Eat Less Often/Avoid: Fatty cuts of meat, [place], or chicken leg, thigh, or wing. Fried cuts of meat or fish.  Dairy (2 to 3 servings)  Eat More Often: Low-fat or fat-free milk, low-fat plain or light yogurt, reduced-fat or part-skim cheese.   Eat Less Often/Avoid: Milk (whole, 2%, skim, or chocolate).Whole milk yogurt. Full-fat cheeses.  Nuts, Seeds, and Legumes (4 to 5 servings per week)  Eat More Often: All without added salt.   Eat Less Often/Avoid: Salted nuts and seeds, canned beans with added salt.  Fats and Sweets (limited)  Eat More Often: Vegetable oils, tub margarines without trans fats, sugar-free gelatin. Mayonnaise and salad dressings.   Eat Less Often/Avoid: Coconut oils, palm oils, butter, stick margarine, cream, half and half, cookies, candy, pie.  FOR MORE INFORMATION The Dash Diet Eating Plan: www.dashdiet.org Document Released: 01/04/2011 Document Reviewed: 12/25/2010 [facility] Patient Information 2012 [place], [place].

## 2011-10-09 NOTE — Assessment & Plan Note (Signed)
BP elevated in clinic today, but home readings 120s/90s. Will continue current regimen for now. Encouraged weight loss, lifestyle modification and referral to Dr. Gerilyn Pilgrim. Recheck BP in one month.

## 2011-10-09 NOTE — Progress Notes (Signed)
  Subjective:    Patient here for follow-up of elevated blood pressure.  She is not exercising, but is adherent to a low-salt diet. For the last month, she has tried to avoid fried foods and salty foods. She has not been exercising, but she works at the [employer] and is on her feet constantly throughout the day.   Patient takes combination lisinopril HCTZ, Norvasc, and metoprolol daily. She denies any side effects. Patient is interested in meeting Dr. Gerilyn Pilgrim, she has met with before and successfully lost 40 pounds at that time. Blood pressure is not well controlled today, the patient says that it is better controlled when she checks it at home.   Patient denies: chest pain, chest pressure/discomfort, dyspnea, fatigue, lower extremity edema and syncope.    Review of Systems Denies any blurry vision, chest pain, numbness or tingling of extremities, headache, fatigue, unilateral weakness.    Objective:    BP 150/83  Pulse 72  Temp 98.2 F (36.8 C) (Oral)  Ht 5\' 8"  (1.727 m)  Wt 346 lb (156.945 kg)  BMI 52.61 kg/m2 General appearance: alert, cooperative, no distress and morbidly obese Lungs: clear to auscultation bilaterally Heart: regular rate and rhythm, S1, S2 normal, no murmur, click, rub or gallop Extremities: 1+ pitting pedal edema bilaterally    Assessment:    Hypertension  Plan:    See problem list

## 2011-10-16 ENCOUNTER — Other Ambulatory Visit: Payer: Self-pay | Admitting: *Deleted

## 2011-10-16 DIAGNOSIS — I10 Essential (primary) hypertension: Secondary | ICD-10-CM

## 2011-10-16 MED ORDER — METOPROLOL SUCCINATE ER 200 MG PO TB24: 200.0000 mg | ORAL_TABLET | Freq: Every day | ORAL | Status: DC

## 2011-10-16 MED ORDER — AMLODIPINE BESYLATE 5 MG PO TABS: 5.0000 mg | ORAL_TABLET | Freq: Every day | ORAL | Status: DC

## 2011-10-16 MED ORDER — LISINOPRIL-HYDROCHLOROTHIAZIDE 20-25 MG PO TABS: 1.0000 | ORAL_TABLET | Freq: Every day | ORAL | Status: DC

## 2011-11-06 ENCOUNTER — Other Ambulatory Visit: Payer: Self-pay | Admitting: Family Medicine

## 2011-11-06 DIAGNOSIS — Z1231 Encounter for screening mammogram for malignant neoplasm of breast: Secondary | ICD-10-CM

## 2011-11-08 ENCOUNTER — Ambulatory Visit (INDEPENDENT_AMBULATORY_CARE_PROVIDER_SITE_OTHER): Payer: 59 | Admitting: Family Medicine

## 2011-11-08 ENCOUNTER — Encounter: Payer: Self-pay | Admitting: Family Medicine

## 2011-11-08 VITALS — BP 151/77 | HR 75 | Temp 98.1°F | Wt 346.0 lb

## 2011-11-08 DIAGNOSIS — I10 Essential (primary) hypertension: Secondary | ICD-10-CM

## 2011-11-08 DIAGNOSIS — M79609 Pain in unspecified limb: Secondary | ICD-10-CM

## 2011-11-08 LAB — BASIC METABOLIC PANEL
CO2: 30 mEq/L (ref 19–32)
Chloride: 102 mEq/L (ref 96–112)
Glucose, Bld: 86 mg/dL (ref 70–99)
Potassium: 4 mEq/L (ref 3.5–5.3)
Sodium: 139 mEq/L (ref 135–145)

## 2011-11-08 LAB — CBC
Hemoglobin: 14.4 g/dL (ref 12.0–15.0)
RBC: 4.7 MIL/uL (ref 3.87–5.11)
WBC: 6.9 10*3/uL (ref 4.0–10.5)

## 2011-11-08 MED ORDER — CYCLOBENZAPRINE HCL 5 MG PO TABS: 5.0000 mg | ORAL_TABLET | Freq: Three times a day (TID) | ORAL | Status: DC | PRN

## 2011-11-08 NOTE — Assessment & Plan Note (Signed)
BP elevated today 151/77.  Patient takes her medications but does not eat a healthy diet.  She is going to meet with a Nutritionist next week and seems motivated to diet and exercise.  She does not want to change her medications today, because she wants to try and lose weight first.  Lifestyle modification for now.  Will recheck BP in 1-2 months. Check CBC, BMET today.

## 2011-11-08 NOTE — Progress Notes (Signed)
  Subjective:     Patient here for follow-up of elevated blood pressure.  She is not exercising and is not adherent to a low-salt diet.  She works in the [employer] and is on her feet 8 hours per day.  Blood pressure is not well controlled at home. Cardiac symptoms: none. Patient denies: chest pain, dyspnea, syncope and lower extremity edema twice per week.  Denies any numbness, slurred speech, or unilateral weakness.  Cardiovascular risk factors: obesity (BMI >= 30 kg/m2). Use of agents associated with hypertension: NSAIDS and MAPP (over the counter) for arthritis as needed for pain.    Endorses burning, warm sensation of RT lower extremity.  Has been diagnosed with DJD and this pain is similar to the pain she had then.   Review of Systems Pertinent items are noted in HPI.     Objective:    General appearance: alert, cooperative and no distress Lungs: clear to auscultation bilaterally Heart: regular rate and rhythm, S1, S2 normal, no murmur, click, rub or gallop Extremities: edema 1+ pitting edema from ankle to shin bilaterally    Assessment:    Hypertension.   Plan:   See Problem List.

## 2011-11-08 NOTE — Patient Instructions (Addendum)
It was great to see you today.  Your BP was 151/77. We will recheck your BP in 1-2 months after you meet with Nutrition. Try to follow DASH diet below and increase physical activity. Schedule follow up appointment with me in 1-2 months. I will send you a letter with results of blood work.  DASH Diet The DASH diet stands for "Dietary Approaches to Stop Hypertension." It is a healthy eating plan that has been shown to reduce high blood pressure (hypertension) in as little as 14 days, while also possibly providing other significant health benefits. These other health benefits include reducing the risk of breast cancer after menopause and reducing the risk of type 2 diabetes, heart disease, colon cancer, and stroke. Health benefits also include weight loss and slowing kidney failure in patients with chronic kidney disease.  DIET GUIDELINES  Limit salt (sodium). Your diet should contain less than 1500 mg of sodium daily.  Limit refined or processed carbohydrates. Your diet should include mostly whole grains. Desserts and added sugars should be used sparingly.  Include small amounts of heart-healthy fats. These types of fats include nuts, oils, and tub margarine. Limit saturated and trans fats. These fats have been shown to be harmful in the body. CHOOSING FOODS  The following food groups are based on a 2000 calorie diet. See your Registered Dietitian for individual calorie needs. Grains and Grain Products (6 to 8 servings daily)  Eat More Often: Whole-wheat bread, brown rice, whole-grain or wheat pasta, quinoa, popcorn without added fat or salt (air popped).  Eat Less Often: White bread, white pasta, white rice, cornbread. Vegetables (4 to 5 servings daily)  Eat More Often: Fresh, frozen, and canned vegetables. Vegetables may be raw, steamed, roasted, or grilled with a minimal amount of fat.  Eat Less Often/Avoid: Creamed or fried vegetables. Vegetables in a cheese sauce. Fruit (4 to 5 servings  daily)  Eat More Often: All fresh, canned (in natural juice), or frozen fruits. Dried fruits without added sugar. One hundred percent fruit juice ( cup [237 mL] daily).  Eat Less Often: Dried fruits with added sugar. Canned fruit in light or heavy syrup. [employer] Locker, Fish, and Poultry (2 servings or less daily. One serving is 3 to 4 oz [85-114 g]).  Eat More Often: Ninety percent or leaner ground beef, tenderloin, sirloin. Round cuts of beef, chicken breast, [place] breast. All fish. Grill, bake, or broil your meat. Nothing should be fried.  Eat Less Often/Avoid: Fatty cuts of meat, [place], or chicken leg, thigh, or wing. Fried cuts of meat or fish. Dairy (2 to 3 servings)  Eat More Often: Low-fat or fat-free milk, low-fat plain or light yogurt, reduced-fat or part-skim cheese.  Eat Less Often/Avoid: Milk (whole, 2%).Whole milk yogurt. Full-fat cheeses. Nuts, Seeds, and Legumes (4 to 5 servings per week)  Eat More Often: All without added salt.  Eat Less Often/Avoid: Salted nuts and seeds, canned beans with added salt. Fats and Sweets (limited)  Eat More Often: Vegetable oils, tub margarines without trans fats, sugar-free gelatin. Mayonnaise and salad dressings.  Eat Less Often/Avoid: Coconut oils, palm oils, butter, stick margarine, cream, half and half, cookies, candy, pie. FOR MORE INFORMATION The Dash Diet Eating Plan: www.dashdiet.org Document Released: 01/04/2011 Document Revised: 04/09/2011 Document Reviewed: 01/04/2011 [facility] Patient Information 2013 [place], [place].

## 2011-11-08 NOTE — Assessment & Plan Note (Signed)
Likely nerve impingement due to history.  No bowel or bladder incontinence. - Start Flexeril 5 mg TID PRN

## 2011-11-14 ENCOUNTER — Encounter: Payer: Self-pay | Admitting: Family Medicine

## 2011-11-20 ENCOUNTER — Encounter: Payer: Self-pay | Admitting: *Deleted

## 2011-11-20 ENCOUNTER — Encounter: Payer: 59 | Attending: Family Medicine | Admitting: *Deleted

## 2011-11-20 DIAGNOSIS — Z713 Dietary counseling and surveillance: Secondary | ICD-10-CM | POA: Insufficient documentation

## 2011-11-20 NOTE — Progress Notes (Signed)
  Medical Nutrition Therapy:  Appt start time: 0800  end time:  0900.   Assessment:  Primary concerns today: obesity.   MEDICATIONS: see list   DIETARY INTAKE:  Usual eating pattern includes 3 meals and 1-2 snacks per day.  Everyday foods include starches, proteins, some vegetables.  Avoided foods include none.    24-hr recall:  B ( AM): smoothie or oatmeal .  May have sandwich Snk ( AM): oranges or [place] sandwich  L ( PM): green beans or meat - sip on soda D ( PM): brown rice, steamed broccoli, chicken Snk ( PM): none Beverages: water Weakness is chicken wings, not much sweet.  Struggles with portions.  Sometimes eats salads  Usual physical activity: none currently   Estimated energy needs: 1800 calories 200 g carbohydrates 135 g protein 50 g fat  Progress Towards Goal(s):  In progress.   Nutritional Diagnosis:  [place]-3.3 Overweight/obesity As related to lilmited adherence to internal hunger and satiety cues, limited activity, and binges on energy-dense foods.  As evidenced by BMI of 52.    Intervention:  Nutrition counseling provided.  Amarria is here for weight loss counseling.  She reports not needing education- she knows what foods are healthy and which ones aren't and she's been able to loose weight in the past through diet restriction and increased activity.  But she's having trouble getting and staying motivated to make healthier changes.  She cares for her niece and nephews, her mother, and her husband, and has neglected herself over the years.  She blames herself for her weight and is discouraged.  Discussed metabolic effects of extreme caloric deprivation from excessive dieting or excessive exercising.  Discussed how her body increases its fats storage  During yo  Yo dieting.  Discussed Intuitive Eating. Instructed Andee to reject the diet mentality- that idea of good food vs bad food.  There are no good and bad foods.  Food is fuel for your body so when you don't get  enough food, you don't get enough fuel.  Assured her that we will discuss nutritional values at a later appointment, but for right now, learn to enjoy eating again.  Encouraged her to, troughout the day, rate hunger on scale from 0-5.  Also check in with emotions.  Try to eat at about a 3 level- don't wait till you're starving; Check in with yourself while you're eating- see how full you're getting.  Then choose the food that is going to be satisfying to you- what will taste good?  Eat slowly, try to make the meal last 20 minutes.  Take small bites, chew slowly and savor each bite.  Then stop eating when satiety is reached.  Don't worry about any leftover on the plate.  don't feel like you need to clean your plate. Eat until your body says stop.  Assured her that it's a learning process, there's not right or wrong way.   Julieanne seemed relieved and very receptive to the new approach to eating.  Monitoring/Evaluation:  Dietary intake, exercise, intuitive eating, and body weight in 1 month(s).

## 2011-11-20 NOTE — Patient Instructions (Addendum)
Stop beating yourself for the food decisions you make.  You are NOT a bad person!  Reject the diet mentality- good food vs bad food.  There are no good and bad food.  Food is fuel for your body so when you don't get enough food, you don't get enough fuel  Don't jump the gun!  gotta to crawl before you can walk.  Don't worry about stressing about exercise right now or nutritional values  Throughout the day, rate hunger on scale from 0-5.  Also check in with emotion Try to eat at about a 3 level- don't wait till you're starving Check in with yourself while you're eating- see how full you're getting  Choose the food that is going to be satisfying to you- what will taste good?  Eat slowly, try to make the meal last 20 minutes.  Take small bites, chew slowly and savor each bite.  Stop when you're full!!!!  It's a learning process, there's not right or wrong way. Don't beat yourself up or get discouraged. :-)

## 2011-12-12 ENCOUNTER — Telehealth: Payer: Self-pay | Admitting: Family Medicine

## 2011-12-12 ENCOUNTER — Ambulatory Visit: Payer: 59

## 2011-12-12 ENCOUNTER — Ambulatory Visit
Admission: RE | Admit: 2011-12-12 | Discharge: 2011-12-12 | Disposition: A | Discharge status: Home / Self Care | Payer: 59 | Source: Ambulatory Visit | Attending: Family Medicine | Admitting: Family Medicine

## 2011-12-12 DIAGNOSIS — Z1231 Encounter for screening mammogram for malignant neoplasm of breast: Secondary | ICD-10-CM

## 2011-12-12 NOTE — Telephone Encounter (Signed)
LVM for patient to change appointment for Nutrition to 11/21 in AM with Dr. Gerilyn Pilgrim and myself.  If patient calls back, please schedule Nutrition appointment with me on 11/21.  Thank you.

## 2011-12-14 NOTE — Telephone Encounter (Addendum)
This patient has an appt on 11/26 with [facility] - does she still need appt with nutrition clinic at [facility]?  pls advise

## 2011-12-14 NOTE — Telephone Encounter (Signed)
Yes, please reschedule to Dr. Gerilyn Pilgrim' clinic on 11/21 instead of the other one.  Thanks.

## 2011-12-21 NOTE — Telephone Encounter (Signed)
Notation:  Called and left message for patient to return call, but never heard back from her.

## 2011-12-25 ENCOUNTER — Ambulatory Visit: Payer: 59 | Admitting: *Deleted

## 2011-12-26 ENCOUNTER — Other Ambulatory Visit: Payer: Self-pay | Admitting: Family Medicine

## 2012-01-10 ENCOUNTER — Ambulatory Visit (INDEPENDENT_AMBULATORY_CARE_PROVIDER_SITE_OTHER): Payer: 59 | Admitting: Family Medicine

## 2012-01-10 ENCOUNTER — Encounter: Payer: Self-pay | Admitting: Family Medicine

## 2012-01-10 VITALS — BP 130/76 | HR 86 | Temp 98.1°F | Ht 68.0 in | Wt 343.5 lb

## 2012-01-10 DIAGNOSIS — I10 Essential (primary) hypertension: Secondary | ICD-10-CM

## 2012-01-10 NOTE — Assessment & Plan Note (Signed)
BP at goal today.  Encouraged patient to keep up the good work.  She seems very motivated to eat healthy and exercise more often.  Patient says LE edema has improved.  She is working with Nutritionist and has lost 3 lbs in 2 months.  Follow up with me in 2 months.  Continue current regimen.

## 2012-01-10 NOTE — Progress Notes (Signed)
  Subjective:     Patient here for follow-up of elevated blood pressure.  She is not exercising as much (walks during work and lifting weights), but is adherent to a low-salt diet.  Blood pressure is well controlled at home. Cardiac symptoms: orthopnea. Patient denies: chest pain, chest pressure/discomfort, dyspnea, lower extremity edema, near-syncope and syncope. Cardiovascular risk factors: obesity (BMI >= 30 kg/m2). Use of agents associated with hypertension: none.  Patient has lost 3 lbs in 2 months.  She has met with a dietician and is working on healthy eating habits.  Review of Systems Per HPI   Objective:    BP 130/76  Pulse 86  Temp 98.1 F (36.7 C) (Oral)  Ht 5\' 8"  (1.727 m)  Wt 343 lb 8 oz (155.811 kg)  BMI 52.23 kg/m2 General appearance: alert, cooperative and no distress Lungs: clear to auscultation bilaterally Heart: regular rate and rhythm, S1, S2 normal, no murmur, click, rub or gallop Extremities: chronic venous stasis, dry, rough skin, trace pitting edema bilaterally   Assessment:    Hypertension, better controlled today.  Plan:    See Problem List.

## 2012-01-10 NOTE — Patient Instructions (Addendum)
Blood pressure looks great! Return to clinic in 2 months or sooner as needed.  DASH Diet The DASH diet stands for "Dietary Approaches to Stop Hypertension." It is a healthy eating plan that has been shown to reduce high blood pressure (hypertension) in as little as 14 days, while also possibly providing other significant health benefits. These other health benefits include reducing the risk of breast cancer after menopause and reducing the risk of type 2 diabetes, heart disease, colon cancer, and stroke. Health benefits also include weight loss and slowing kidney failure in patients with chronic kidney disease.  DIET GUIDELINES  Limit salt (sodium). Your diet should contain less than 1500 mg of sodium daily.  Limit refined or processed carbohydrates. Your diet should include mostly whole grains. Desserts and added sugars should be used sparingly.  Include small amounts of heart-healthy fats. These types of fats include nuts, oils, and tub margarine. Limit saturated and trans fats. These fats have been shown to be harmful in the body. CHOOSING FOODS  The following food groups are based on a 2000 calorie diet. See your Registered Dietitian for individual calorie needs. Grains and Grain Products (6 to 8 servings daily)  Eat More Often: Whole-wheat bread, brown rice, whole-grain or wheat pasta, quinoa, popcorn without added fat or salt (air popped).  Eat Less Often: White bread, white pasta, white rice, cornbread. Vegetables (4 to 5 servings daily)  Eat More Often: Fresh, frozen, and canned vegetables. Vegetables may be raw, steamed, roasted, or grilled with a minimal amount of fat.  Eat Less Often/Avoid: Creamed or fried vegetables. Vegetables in a cheese sauce. Fruit (4 to 5 servings daily)  Eat More Often: All fresh, canned (in natural juice), or frozen fruits. Dried fruits without added sugar. One hundred percent fruit juice ( cup [237 mL] daily).  Eat Less Often: Dried fruits with added  sugar. Canned fruit in light or heavy syrup. [employer], Fish, and Poultry (2 servings or less daily. One serving is 3 to 4 oz [85-114 g]).  Eat More Often: Ninety percent or leaner ground beef, tenderloin, sirloin. Round cuts of beef, chicken breast, [place] breast. All fish. Grill, bake, or broil your meat. Nothing should be fried.  Eat Less Often/Avoid: Fatty cuts of meat, [place], or chicken leg, thigh, or wing. Fried cuts of meat or fish. Dairy (2 to 3 servings)  Eat More Often: Low-fat or fat-free milk, low-fat plain or light yogurt, reduced-fat or part-skim cheese.  Eat Less Often/Avoid: Milk (whole, 2%).Whole milk yogurt. Full-fat cheeses. Nuts, Seeds, and Legumes (4 to 5 servings per week)  Eat More Often: All without added salt.  Eat Less Often/Avoid: Salted nuts and seeds, canned beans with added salt. Fats and Sweets (limited)  Eat More Often: Vegetable oils, tub margarines without trans fats, sugar-free gelatin. Mayonnaise and salad dressings.  Eat Less Often/Avoid: Coconut oils, palm oils, butter, stick margarine, cream, half and half, cookies, candy, pie. FOR MORE INFORMATION The Dash Diet Eating Plan: www.dashdiet.org Document Released: 01/04/2011 Document Revised: 04/09/2011 Document Reviewed: 01/04/2011 [facility] Patient Information 2013 [place], [place].

## 2012-02-25 ENCOUNTER — Other Ambulatory Visit: Payer: Self-pay | Admitting: Family Medicine

## 2012-03-15 ENCOUNTER — Other Ambulatory Visit: Payer: Self-pay

## 2012-05-19 ENCOUNTER — Other Ambulatory Visit: Payer: Self-pay | Admitting: *Deleted

## 2012-05-19 MED ORDER — METOPROLOL SUCCINATE ER 200 MG PO TB24: ORAL_TABLET | ORAL | Status: DC

## 2012-05-19 MED ORDER — LISINOPRIL-HYDROCHLOROTHIAZIDE 20-25 MG PO TABS: ORAL_TABLET | ORAL | Status: DC

## 2012-05-19 MED ORDER — CYCLOBENZAPRINE HCL 5 MG PO TABS: 5.0000 mg | ORAL_TABLET | Freq: Three times a day (TID) | ORAL | Status: DC | PRN

## 2012-05-19 MED ORDER — AMLODIPINE BESYLATE 5 MG PO TABS: ORAL_TABLET | ORAL | Status: DC

## 2012-07-21 ENCOUNTER — Ambulatory Visit (INDEPENDENT_AMBULATORY_CARE_PROVIDER_SITE_OTHER): Payer: 59 | Admitting: Family Medicine

## 2012-07-21 ENCOUNTER — Encounter: Payer: Self-pay | Admitting: Family Medicine

## 2012-07-21 ENCOUNTER — Ambulatory Visit: Payer: 59 | Admitting: Family Medicine

## 2012-07-21 VITALS — BP 134/75 | HR 66 | Temp 98.1°F | Ht 68.0 in | Wt 340.0 lb

## 2012-07-21 DIAGNOSIS — E669 Obesity, unspecified: Secondary | ICD-10-CM

## 2012-07-21 DIAGNOSIS — E739 Lactose intolerance, unspecified: Secondary | ICD-10-CM

## 2012-07-21 DIAGNOSIS — I10 Essential (primary) hypertension: Secondary | ICD-10-CM

## 2012-07-21 LAB — POCT GLYCOSYLATED HEMOGLOBIN (HGB A1C): Hemoglobin A1C: 6

## 2012-07-21 MED ORDER — METOPROLOL SUCCINATE ER 200 MG PO TB24: ORAL_TABLET | ORAL | Status: DC

## 2012-07-21 MED ORDER — LISINOPRIL-HYDROCHLOROTHIAZIDE 20-25 MG PO TABS: ORAL_TABLET | ORAL | Status: DC

## 2012-07-21 MED ORDER — AMLODIPINE BESYLATE 5 MG PO TABS: ORAL_TABLET | ORAL | Status: DC

## 2012-07-21 NOTE — Progress Notes (Signed)
  Subjective:    Patient here for follow-up of elevated blood pressure.  She is not exercising much as she used to (still goes to gym at the hospital), is adherent to a low-salt diet.  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  Blood pressure is well controlled at home. Cardiac symptoms: none. Patient denies: chest pain, dyspnea, fatigue, near-syncope, palpitations and syncope, SOB.  Use of agents associated with hypertension: None.  She takes Lisinopril-HCTZ daily, Metoprolol 200 once per day, Amlodipine daily.  She is compliant with medications and BP is at goal.  She has had borderline A1c (6.0 in 2012) in the past. She has lost about 3 lb since I last saw her, but she admits that she needs to lose more weight and is interested in calling Dr. Gerilyn Pilgrim for an appointment. She met with her in the past and said she lost about 40 lb.  She is not on a specific diet, but 1-2 days per week, patient will "fast" in which she drinks smoothies for 2/3 of her meals.  Review of Systems Per HPI   Objective:    BP 134/75  Pulse 66  Temp(Src) 98.1 F (36.7 C) (Oral)  Ht 5\' 8"  (1.727 m)  Wt 340 lb (154.223 kg)  BMI 51.71 kg/m2 General appearance: alert, cooperative and no distress Head: Normocephalic, without obvious abnormality, atraumatic Lungs: clear to auscultation bilaterally Heart: regular rate and rhythm, S1, S2 normal, no murmur, click, rub or gallop Extremities: trace pitting edema bilatearlly Skin: skin turgor is rough, dry with mild swelling, but no open lesions or weeping    Assessment:    Hypertension.  Lifestyle modification.    Plan:    See Problem List.

## 2012-07-21 NOTE — Assessment & Plan Note (Signed)
BP at goal.  Congratulated patient on job well done.  Will continue 4 medications for now, but may be able to cut back on medications if she can continue to eat a low sodium diet and lose weight.  Follow up in 3 months to meet new PCP or sooner as needed.

## 2012-07-21 NOTE — Assessment & Plan Note (Signed)
Last A1c was 6.0 in 2012.  She has not gained more weight, but due to family hx of DM and hx of borderline elevated CBG, will check A1c again today.  Consider starting Metformin if level is either diabetic or pre-diabetic as Metformin can help with weight loss.

## 2012-07-21 NOTE — Patient Instructions (Signed)
Please call Dr. Gerilyn Pilgrim to schedule Nutrition appointment.

## 2012-07-23 ENCOUNTER — Encounter: Payer: Self-pay | Admitting: Family Medicine

## 2012-12-04 ENCOUNTER — Other Ambulatory Visit: Payer: Self-pay

## 2012-12-29 ENCOUNTER — Other Ambulatory Visit: Payer: Self-pay

## 2012-12-29 DIAGNOSIS — Z1231 Encounter for screening mammogram for malignant neoplasm of breast: Secondary | ICD-10-CM

## 2013-02-04 ENCOUNTER — Ambulatory Visit
Admission: RE | Admit: 2013-02-04 | Discharge: 2013-02-04 | Disposition: A | Discharge status: Home / Self Care | Payer: 59 | Source: Ambulatory Visit

## 2013-02-04 DIAGNOSIS — Z1231 Encounter for screening mammogram for malignant neoplasm of breast: Secondary | ICD-10-CM

## 2013-09-04 ENCOUNTER — Ambulatory Visit (INDEPENDENT_AMBULATORY_CARE_PROVIDER_SITE_OTHER): Payer: 59 | Admitting: Family Medicine

## 2013-09-04 ENCOUNTER — Encounter: Payer: Self-pay | Admitting: Family Medicine

## 2013-09-04 VITALS — BP 128/84 | HR 70 | Temp 98.2°F | Wt 327.0 lb

## 2013-09-04 DIAGNOSIS — I10 Essential (primary) hypertension: Secondary | ICD-10-CM

## 2013-09-04 DIAGNOSIS — Z0000 Encounter for general adult medical examination without abnormal findings: Secondary | ICD-10-CM

## 2013-09-04 HISTORY — DX: Encounter for general adult medical examination without abnormal findings: Z00.00

## 2013-09-04 MED ORDER — LISINOPRIL-HYDROCHLOROTHIAZIDE 20-25 MG PO TABS: ORAL_TABLET | ORAL | Status: DC

## 2013-09-04 MED ORDER — METOPROLOL SUCCINATE ER 200 MG PO TB24: ORAL_TABLET | ORAL | Status: DC

## 2013-09-04 MED ORDER — AMLODIPINE BESYLATE 5 MG PO TABS: ORAL_TABLET | ORAL | Status: DC

## 2013-09-04 NOTE — Assessment & Plan Note (Signed)
Controlled. Continues to eat healthy and lose weight. Congratulated on the success.  - continue current regimen - bmp today  - direct ldl  - plan of action will be determined by lab results

## 2013-09-04 NOTE — Patient Instructions (Addendum)
Thank you for coming in,   Keep up the great work, I think you're doing awesome.   I'll call you with the results if there is anything off your baseline.    Please feel free to call with any questions or concerns at any time, at [phone]. --Dr. Raeford Razor  Hypertension Hypertension, commonly called high blood pressure, is when the force of blood pumping through your arteries is too strong. Your arteries are the blood vessels that carry blood from your heart throughout your body. A blood pressure reading consists of a higher number over a lower number, such as 110/72. The higher number (systolic) is the pressure inside your arteries when your heart pumps. The lower number (diastolic) is the pressure inside your arteries when your heart relaxes. Ideally you want your blood pressure below 120/80. Hypertension forces your heart to work harder to pump blood. Your arteries may become narrow or stiff. Having hypertension puts you at risk for heart disease, stroke, and other problems.  RISK FACTORS Some risk factors for high blood pressure are controllable. Others are not.  Risk factors you cannot control include:   Race. You may be at higher risk if you are African American.  Age. Risk increases with age.  Gender. Men are at higher risk than women before age 35 years. After age 61, women are at higher risk than men. Risk factors you can control include:  Not getting enough exercise or physical activity.  Being overweight.  Getting too much fat, sugar, calories, or salt in your diet.  Drinking too much alcohol. SIGNS AND SYMPTOMS Hypertension does not usually cause signs or symptoms. Extremely high blood pressure (hypertensive crisis) may cause headache, anxiety, shortness of breath, and nosebleed. DIAGNOSIS  To check if you have hypertension, your health care provider will measure your blood pressure while you are seated, with your arm held at the level of your heart. It should be measured at  least twice using the same arm. Certain conditions can cause a difference in blood pressure between your right and left arms. A blood pressure reading that is higher than normal on one occasion does not mean that you need treatment. If one blood pressure reading is high, ask your health care provider about having it checked again. TREATMENT  Treating high blood pressure includes making lifestyle changes and possibly taking medicine. Living a healthy lifestyle can help lower high blood pressure. You may need to change some of your habits. Lifestyle changes may include:  Following the DASH diet. This diet is high in fruits, vegetables, and whole grains. It is low in salt, red meat, and added sugars.  Getting at least 2 hours of brisk physical activity every week.  Losing weight if necessary.  Not smoking.  Limiting alcoholic beverages.  Learning ways to reduce stress. If lifestyle changes are not enough to get your blood pressure under control, your health care provider may prescribe medicine. You may need to take more than one. Work closely with your health care provider to understand the risks and benefits. HOME CARE INSTRUCTIONS  Have your blood pressure rechecked as directed by your health care provider.   Take medicines only as directed by your health care provider. Follow the directions carefully. Blood pressure medicines must be taken as prescribed. The medicine does not work as well when you skip doses. Skipping doses also puts you at risk for problems.   Do not smoke.   Monitor your blood pressure at home as directed by your health  care provider. SEEK MEDICAL CARE IF:   You think you are having a reaction to medicines taken.  You have recurrent headaches or feel dizzy.  You have swelling in your ankles.  You have trouble with your vision. SEEK IMMEDIATE MEDICAL CARE IF:  You develop a severe headache or confusion.  You have unusual weakness, numbness, or feel  faint.  You have severe chest or abdominal pain.  You vomit repeatedly.  You have trouble breathing. MAKE SURE YOU:   Understand these instructions.  Will watch your condition.  Will get help right away if you are not doing well or get worse. Document Released: 01/15/2005 Document Revised: 06/01/2013 Document Reviewed: 11/07/2012 [facility] Patient Information 2015 [place], [place]. This information is not intended to replace advice given to you by your health care provider. Make sure you discuss any questions you have with your health care provider.

## 2013-09-04 NOTE — Progress Notes (Signed)
   Subjective:    Patient ID: Misty Maxwell, female    DOB: 1965/11/29, 48 y.o.   MRN: [id number]  HPI Misty Maxwell is here for HTN.  HTN Disease Monitoring:yes Home BP Monitoring 142/77 Chest pain- no    Dyspnea- no Medications:amlodipine, lisiniopril-HCTZ  Compliance-  yes. Lightheadedness-  no Edema- no        Current Outpatient Prescriptions on File Prior to Visit  Medication Sig Dispense Refill  . amLODipine (NORVASC) 5 MG tablet TAKE 1 TABLET BY MOUTH DAILY.  90 tablet  3  . aspirin 81 MG tablet Take 81 mg by mouth daily.      . bimatoprost (LUMIGAN) 0.03 % ophthalmic drops        . Black Cohosh 540 MG CAPS        . Calcium 500-125 MG-UNIT TABS        . cyclobenzaprine (FLEXERIL) 5 MG tablet Take 1 tablet (5 mg total) by mouth 3 (three) times daily as needed for muscle spasms.  30 tablet  0  . dorzolamide-timolol (COSOPT) 22.3-6.8 MG/ML ophthalmic solution Place 1 drop into both eyes 2 (two) times daily.      Marland Kitchen lisinopril-hydrochlorothiazide (PRINZIDE,ZESTORETIC) 20-25 MG per tablet TAKE 1 TABLET BY MOUTH DAILY.  90 tablet  3  . metoprolol (TOPROL-XL) 200 MG 24 hr tablet TAKE 1 TABLET BY MOUTH DAILY.  90 tablet  3  . Multiple Vitamin (MULTIVITAMIN) capsule         No current facility-administered medications on file prior to visit.    SHx: never smoker.   Health Maintenance: pap smear due. Mammogram in January was normal.   Review of Systems See HPI     Objective:   Physical Exam BP 128/84  Pulse 70  Temp(Src) 98.2 F (36.8 C) (Oral)  Wt 327 lb (148.326 kg) Gen: NAD, alert, cooperative with exam, well-appearing, morbidly obese CV: RRR, good S1/S2, no murmur, no edema, capillary refill brisk  Resp: CTABL, no wheezes, non-labored Neuro: no gross deficits.  Psych: good insight, alert and oriented     Assessment & Plan:

## 2013-09-04 NOTE — Assessment & Plan Note (Signed)
Mammogram in January 2015 normal  Due for pap smear.  Never smoker  occasionally drinks alcohol

## 2013-09-05 LAB — BASIC METABOLIC PANEL
BUN: 19 mg/dL (ref 6–23)
CHLORIDE: 102 meq/L (ref 96–112)
CO2: 27 mEq/L (ref 19–32)
CREATININE: 0.95 mg/dL (ref 0.50–1.10)
Calcium: 9.5 mg/dL (ref 8.4–10.5)
Glucose, Bld: 95 mg/dL (ref 70–99)
Potassium: 3.7 mEq/L (ref 3.5–5.3)
Sodium: 138 mEq/L (ref 135–145)

## 2013-09-05 LAB — LDL CHOLESTEROL, DIRECT: LDL DIRECT: 124 mg/dL — AB

## 2013-09-06 ENCOUNTER — Other Ambulatory Visit: Payer: Self-pay | Admitting: Family Medicine

## 2013-09-06 DIAGNOSIS — I10 Essential (primary) hypertension: Secondary | ICD-10-CM

## 2013-09-07 ENCOUNTER — Telehealth: Payer: Self-pay | Admitting: *Deleted

## 2013-09-07 NOTE — Telephone Encounter (Signed)
Done.Sharry Beining, Lewie Loron

## 2013-09-07 NOTE — Progress Notes (Unsigned)
Patient informed,please see previous message from Dr Raeford Razor.Misty Maxwell, Misty Maxwell

## 2013-09-10 NOTE — Telephone Encounter (Signed)
Pt was called by Denice Paradise but did not understand. Would like to speak to Denice Paradise again for clarification from previous call. No notes in EPIC of why she was called. Please call patient at [phone] or [phone]

## 2013-09-14 NOTE — Telephone Encounter (Signed)
Left message for a return call.Misty Maxwell  

## 2014-02-05 ENCOUNTER — Other Ambulatory Visit: Payer: Self-pay

## 2014-02-05 DIAGNOSIS — Z1231 Encounter for screening mammogram for malignant neoplasm of breast: Secondary | ICD-10-CM

## 2014-02-19 ENCOUNTER — Ambulatory Visit: Payer: 59

## 2014-03-19 ENCOUNTER — Ambulatory Visit: Payer: 59

## 2014-04-05 ENCOUNTER — Ambulatory Visit
Admission: RE | Admit: 2014-04-05 | Discharge: 2014-04-05 | Disposition: A | Discharge status: Home / Self Care | Payer: 59 | Source: Ambulatory Visit

## 2014-04-05 DIAGNOSIS — Z1231 Encounter for screening mammogram for malignant neoplasm of breast: Secondary | ICD-10-CM

## 2014-04-08 ENCOUNTER — Telehealth: Payer: Self-pay | Admitting: Family Medicine

## 2014-04-08 NOTE — Telephone Encounter (Signed)
-----   Message from Lind Covert, MD sent at 04/06/2014  8:51 AM EST -----   ----- Message -----    From: Rad Results In Interface    Sent: 04/05/2014  10:47 AM      To: Lind Covert, MD

## 2014-04-08 NOTE — Telephone Encounter (Signed)
Left voicemail for patient to call clinic back.    Rosemarie Ax, MD PGY-2, [facility] 04/08/2014, 1:14 PM

## 2014-05-17 ENCOUNTER — Other Ambulatory Visit: Payer: Self-pay | Admitting: *Deleted

## 2014-05-18 MED ORDER — CYCLOBENZAPRINE HCL 5 MG PO TABS: 5.0000 mg | ORAL_TABLET | Freq: Three times a day (TID) | ORAL | Status: DC | PRN

## 2014-06-01 ENCOUNTER — Emergency Department (HOSPITAL_COMMUNITY)
Admission: EM | Admit: 2014-06-01 | Discharge: 2014-06-01 | Disposition: A | Discharge status: Home / Self Care | Payer: 59 | Source: Home / Self Care | Attending: Family Medicine | Admitting: Family Medicine

## 2014-06-01 ENCOUNTER — Encounter (HOSPITAL_COMMUNITY): Payer: Self-pay | Admitting: *Deleted

## 2014-06-01 DIAGNOSIS — M5442 Lumbago with sciatica, left side: Secondary | ICD-10-CM

## 2014-06-01 MED ORDER — DICLOFENAC POTASSIUM 50 MG PO TABS: 50.0000 mg | ORAL_TABLET | Freq: Three times a day (TID) | ORAL | Status: DC

## 2014-06-01 MED ORDER — METAXALONE 800 MG PO TABS: 800.0000 mg | ORAL_TABLET | Freq: Three times a day (TID) | ORAL | Status: DC

## 2014-06-01 MED ORDER — METHYLPREDNISOLONE 4 MG PO TBPK: ORAL_TABLET | ORAL | Status: DC

## 2014-06-01 MED ORDER — METHYLPREDNISOLONE ACETATE 80 MG/ML IJ SUSP
INTRAMUSCULAR | Status: AC
  Filled 2014-06-01: qty 1

## 2014-06-01 MED ORDER — METHYLPREDNISOLONE ACETATE 80 MG/ML IJ SUSP
80.0000 mg | Freq: Once | INTRAMUSCULAR | Status: AC
  Administered 2014-06-01: 80 mg via INTRAMUSCULAR

## 2014-06-01 NOTE — ED Notes (Signed)
Pt  Reports     Low  Back  Pain  With  Pain  Radiating  Down  l  Leg    Pt   Reports  Has  A  History  Of  Back  Problems in past       -   She  Reports  The  Pain  Worse  Today    Pt states  She  Has  Tried a  Muscle  Relaxant as  Well  As  A  Pain pill    Without releif

## 2014-06-01 NOTE — ED Provider Notes (Addendum)
CSN: [id number]     Arrival date & time 06/01/14  1045 History   None    Chief Complaint  Patient presents with  . Back Pain   (Consider location/radiation/quality/duration/timing/severity/associated sxs/prior Treatment) Patient is a 49 y.o. female presenting with back pain. The history is provided by the patient.  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   Past Medical History  Diagnosis Date  . Hypertension   . Arthritis   . Impaired glucose tolerance   . Obesity    History reviewed. No pertinent past surgical history. Family History  Problem Relation Age of Onset  . Diabetes Father   . Stroke Father   . Aneurysm Father   . Stroke Maternal Grandmother   . Diabetes Paternal Grandmother   . Hypertension Mother    History  Substance Use Topics  . Smoking status: Never Smoker   . Smokeless tobacco: Not on file  . Alcohol Use: Yes   OB History    No data available     Review of Systems  Constitutional: Negative.   Gastrointestinal: Negative for abdominal pain and bowel incontinence.  Genitourinary: Negative for bladder incontinence, dysuria and pelvic pain.  Musculoskeletal: Positive for back pain. Negative for joint swelling and gait problem.  Skin: Negative.   Neurological: Negative for weakness.    Allergies  Review of patient's allergies indicates no known allergies.  Home Medications   Prior to  Admission medications   Medication Sig Start Date End Date Taking? Authorizing Provider  amLODipine (NORVASC) 5 MG tablet TAKE 1 TABLET BY MOUTH DAILY. 09/04/13   Rosemarie Ax, MD  aspirin 81 MG tablet Take 81 mg by mouth daily.    Historical Provider, MD  bimatoprost (LUMIGAN) 0.03 % ophthalmic drops      Historical Provider, MD  Black Cohosh [address] [place] Provider, MD  Calcium 500-125 MG-UNIT TABS      Historical Provider, MD  cyclobenzaprine (FLEXERIL) 5 MG tablet Take 1 tablet (5 mg total) by mouth 3 (three) times daily as needed for muscle spasms. 05/18/14   Leone Brand, MD  diclofenac (CATAFLAM) 50 MG tablet Take 1 tablet (50 mg total) by mouth 3 (three) times daily. 06/01/14   Billy Fischer, MD  dorzolamide-timolol (COSOPT) 22.3-6.8 MG/ML ophthalmic solution Place 1 drop into both eyes 2 (two) times daily.    Historical Provider, MD  lisinopril-hydrochlorothiazide (PRINZIDE,ZESTORETIC) 20-25 MG per tablet TAKE 1 TABLET BY MOUTH DAILY. 09/04/13   Rosemarie Ax, MD  metaxalone (SKELAXIN) 800 MG tablet Take 1 tablet (800 mg total) by mouth 3 (three) times daily. 06/01/14   Billy Fischer, MD  methylPREDNISolone (MEDROL DOSEPAK) 4 MG TBPK tablet follow package directions start on wed. 06/01/14   Billy Fischer, MD  metoprolol (TOPROL-XL) 200 MG 24 hr tablet TAKE 1 TABLET BY MOUTH DAILY. 09/04/13   Rosemarie Ax, MD  Multiple Vitamin (MULTIVITAMIN) capsule      Historical Provider, MD   BP 112/75 mmHg  Pulse 64  Temp(Src) 97.8 F (36.6 C) (Oral)  Resp 20  SpO2 96%  LMP 09/29/2010 Physical Exam  Constitutional: She is oriented to person, place, and time. She appears well-developed and well-nourished. No distress.  Abdominal: Soft. Bowel sounds are normal. There is no tenderness.  Musculoskeletal: She exhibits tenderness.       Lumbar back: She exhibits tenderness, pain and spasm. She exhibits no swelling.       Back:  Neurological: She is alert and oriented to person,  place, and time.  Skin: Skin is warm and dry.  Nursing note and vitals reviewed.   ED Course  Procedures (including critical care time) Labs Review Labs Reviewed - No data to display  Imaging Review No results found.   MDM   1. Midline low back pain with left-sided sciatica        Billy Fischer, MD 06/01/14 1411  Billy Fischer, MD 06/01/14 [phone]

## 2014-06-01 NOTE — Discharge Instructions (Signed)
Take medicine as prescribed and see your doctor after your trip.

## 2014-09-22 ENCOUNTER — Ambulatory Visit (INDEPENDENT_AMBULATORY_CARE_PROVIDER_SITE_OTHER): Payer: 59 | Admitting: Family Medicine

## 2014-09-22 VITALS — BP 132/90 | HR 85 | Temp 98.3°F | Resp 18 | Ht 68.5 in | Wt 340.0 lb

## 2014-09-22 DIAGNOSIS — G8929 Other chronic pain: Secondary | ICD-10-CM | POA: Diagnosis not present

## 2014-09-22 DIAGNOSIS — M545 Low back pain: Secondary | ICD-10-CM | POA: Diagnosis not present

## 2014-09-22 NOTE — Progress Notes (Signed)
This is a 49 year old [employer] employee works in [employer]. She's been having left-sided back pain in her lumbar region for the last 6 months. She was seen in May and given some prednisone and muscle relaxers and seem to help for short period time the pain is come back. She cares for her husband has had a stroke and recently had to lift him off the floor. Since that time she's had sharp pain in the left side. There's been no radiation of the pain however.  The pain is worse when she stands.  No fever, no weakness or numbness in the left leg. Patient is very upset with the pain and is crying intermittently. She still has not missed any work because she lives paycheck to [place].  No incontinence, no polyuria  Objective: Vital signs normal HEENT: Unremarkable Chest: Clear Heart: Regular no murmur Abdomen: Soft nontender Back: Nontender with no scoliosis. Patient is grossly obese Straight-leg raising is negative Reflexes are normal in the knees and ankles Extremities show no edema but she does have some postphlebitic changes in both legs with hyperkeratosis in the anterior shins. Her range of motion in her lower exam is normal.  Assessment: Chronic low back pain with acute exacerbation in the left side.  Plan: Medrol dose pack and gabapentin 100 mg 3 times a day. I've asked patient call me back if the pain is not getting better in 48 hours.  Signed, Carola Frost.D.

## 2014-10-18 ENCOUNTER — Other Ambulatory Visit: Payer: Self-pay | Admitting: *Deleted

## 2014-10-18 DIAGNOSIS — I10 Essential (primary) hypertension: Secondary | ICD-10-CM

## 2014-10-19 MED ORDER — METOPROLOL SUCCINATE ER 200 MG PO TB24: ORAL_TABLET | ORAL | Status: DC

## 2014-10-19 MED ORDER — LISINOPRIL-HYDROCHLOROTHIAZIDE 20-25 MG PO TABS: ORAL_TABLET | ORAL | Status: DC

## 2014-10-19 MED ORDER — AMLODIPINE BESYLATE 5 MG PO TABS
ORAL_TABLET | ORAL | Status: DC
Start: 2014-10-19 — End: 2015-04-27

## 2014-11-02 ENCOUNTER — Ambulatory Visit (INDEPENDENT_AMBULATORY_CARE_PROVIDER_SITE_OTHER): Payer: 59 | Admitting: Family Medicine

## 2014-11-02 ENCOUNTER — Other Ambulatory Visit (HOSPITAL_COMMUNITY)
Admission: RE | Admit: 2014-11-02 | Discharge: 2014-11-02 | Disposition: A | Discharge status: Home / Self Care | Payer: 59 | Source: Ambulatory Visit | Attending: Family Medicine | Admitting: Family Medicine

## 2014-11-02 VITALS — Temp 97.9°F | Ht 69.0 in | Wt 341.6 lb

## 2014-11-02 DIAGNOSIS — I10 Essential (primary) hypertension: Secondary | ICD-10-CM | POA: Diagnosis not present

## 2014-11-02 DIAGNOSIS — Z1151 Encounter for screening for human papillomavirus (HPV): Secondary | ICD-10-CM | POA: Diagnosis not present

## 2014-11-02 DIAGNOSIS — Z01419 Encounter for gynecological examination (general) (routine) without abnormal findings: Secondary | ICD-10-CM | POA: Diagnosis present

## 2014-11-02 DIAGNOSIS — M5442 Lumbago with sciatica, left side: Secondary | ICD-10-CM

## 2014-11-02 DIAGNOSIS — Z124 Encounter for screening for malignant neoplasm of cervix: Secondary | ICD-10-CM | POA: Diagnosis not present

## 2014-11-02 DIAGNOSIS — Z23 Encounter for immunization: Secondary | ICD-10-CM | POA: Diagnosis not present

## 2014-11-02 DIAGNOSIS — R7303 Prediabetes: Secondary | ICD-10-CM | POA: Diagnosis not present

## 2014-11-02 DIAGNOSIS — Z0000 Encounter for general adult medical examination without abnormal findings: Secondary | ICD-10-CM | POA: Diagnosis not present

## 2014-11-02 LAB — LIPID PANEL
CHOL/HDL RATIO: 3.6 ratio (ref <=5.0)
CHOLESTEROL: 210 mg/dL — AB (ref 125–200)
HDL: 58 mg/dL (ref >=46)
LDL Cholesterol: 131 mg/dL — ABNORMAL HIGH (ref <130)
Triglycerides: 106 mg/dL (ref <150)
VLDL: 21 mg/dL (ref <30)

## 2014-11-02 LAB — CBC
HEMATOCRIT: 42.3 % (ref 36.0–46.0)
Hemoglobin: 14.7 g/dL (ref 12.0–15.0)
MCH: 31.9 pg (ref 26.0–34.0)
MCHC: 34.8 g/dL (ref 30.0–36.0)
MCV: 91.8 fL (ref 78.0–100.0)
MPV: 10.5 fL (ref 8.6–12.4)
Platelets: 237 10*3/uL (ref 150–400)
RBC: 4.61 MIL/uL (ref 3.87–5.11)
RDW: 13.5 % (ref 11.5–15.5)
WBC: 7 10*3/uL (ref 4.0–10.5)

## 2014-11-02 LAB — BASIC METABOLIC PANEL WITH GFR
BUN: 15 mg/dL (ref 7–25)
CHLORIDE: 100 mmol/L (ref 98–110)
CO2: 26 mmol/L (ref 20–31)
CREATININE: 0.8 mg/dL (ref 0.50–1.10)
Calcium: 9.9 mg/dL (ref 8.6–10.2)
GFR, Est African American: >89 mL/min (ref >=60)
GFR, Est Non African American: 87 mL/min (ref >=60)
Glucose, Bld: 105 mg/dL — ABNORMAL HIGH (ref 65–99)
Potassium: 4 mmol/L (ref 3.5–5.3)
SODIUM: 134 mmol/L — AB (ref 135–146)

## 2014-11-02 LAB — TSH: TSH: 2.083 u[IU]/mL (ref 0.350–4.500)

## 2014-11-02 LAB — POCT GLYCOSYLATED HEMOGLOBIN (HGB A1C): HEMOGLOBIN A1C: 6

## 2014-11-02 NOTE — Assessment & Plan Note (Signed)
Hx of right sided lumbar radiculopathy but now having left sided  Has been seeing a chiropractor  Taking herbal supplements with some relief.  - continue to monitor for now.  - would encourage to lose weight

## 2014-11-02 NOTE — Patient Instructions (Signed)
Thank you for coming in,   I will call or send a letter with the results from today.   Feel free to set an appointment with me if you would like to talk.   Please bring all of your medications with you to each visit.   Sign up for My Chart to have easy access to your labs results, and communication with your Primary care physician   Please feel free to call with any questions or concerns at any time, at [phone]. --Dr. Raeford Razor

## 2014-11-02 NOTE — Assessment & Plan Note (Signed)
Hx of prediabetes.  - A1c today  - if elevated. May need to start metformin  - she has seen Dr. Jenne Campus before  - she has several social issues going on at home that may interfere with control. Passing of relatives and her husband recently had a stroke.

## 2014-11-02 NOTE — Progress Notes (Signed)
Subjective:    Misty Maxwell - 49 y.o. female MRN [id number]  Date of birth: 1965/12/23  HPI  Misty Maxwell is here for annual exam.  Annual Gynecological Exam  G0P0  Wt Readings from Last 3 Encounters:  11/02/14 341 lb 9.6 oz (154.949 kg)  09/22/14 340 lb (154.223 kg)  09/04/13 327 lb (148.326 kg)   Last period: 09/29/2010 Regular periods: no. Has been inconsistent. Returned last month. Has been gone for over a year prior to blast month  Heavy bleeding: no  Sexually active: yes Birth control or hormonal therapy: no Hx of STD: Patient desires STD screening Dyspareunia: No Hot flashes: yes. Becoming less now. Occuring throughout day  Vaginal discharge: no Dysuria:No   Last mammogram: 04/05/2014 Breast mass or concerns: No Last Pap: today History of abnormal pap: No   FH of breast, uterine, ovarian, colon cancer: No  Back pain:  Reported hx of sciatica. She has seen a chiropractor in the past She has been on gabapentin and placed on systemic steroid-induced which does seem to help much Pain is occurring on the left side with radiculopathy down her left leg down to her foot She has taken some seasonal herbs which have been helping some  HTN Disease Monitoring: Home BP Monitoring yes usually 130/84  Medications:Metoprolol, lisinopril, hydrochlorothiazide, amlodipine Chest pain- no     Dyspnea- no Compliance-  yes.  Lightheadedness-  no   Edema- no   Prediabetes:  She has a hx of as well as morbid obesity  Last A1c of 6.0  She has seen Dr. Jenne Campus previously.  Not currently taking any medication  Is asymptomatic today.   Health Maintenance:  Health Maintenance Due  Topic Date Due  . HIV Screening  05/02/1980  . PAP SMEAR  01/30/2008  . INFLUENZA VACCINE  08/30/2014    -  reports that she has never smoked. She does not have any smokeless tobacco history on file. - Review of Systems: Per HPI. - Past Medical History: Patient Active Problem List   Diagnosis Date Noted  . Health care maintenance 09/04/2013  . Prediabetes 05/02/2009  . METABOLIC SYNDROME X 10/93/2355  . KNEE PAIN, BILATERAL 08/11/2007  . LEG PAIN, RIGHT 08/11/2007  . PARESTHESIA 08/11/2007  . Back pain 08/04/2007  . OBESITY, MORBID 03/28/2006  . GLAUCOMA 03/28/2006  . HYPERTENSION, BENIGN SYSTEMIC 03/28/2006  . OSTEOARTHRITIS, LOWER LEG 03/28/2006   - Medications: reviewed and updated Current Outpatient Prescriptions  Medication Sig Dispense Refill  . amLODipine (NORVASC) 5 MG tablet TAKE 1 TABLET BY MOUTH DAILY. 90 tablet 1  . aspirin 81 MG tablet Take 81 mg by mouth daily.    . bimatoprost (LUMIGAN) 0.03 % ophthalmic drops      . Black Cohosh 540 MG CAPS      . Calcium 500-125 MG-UNIT TABS      . cyclobenzaprine (FLEXERIL) 5 MG tablet Take 1 tablet (5 mg total) by mouth 3 (three) times daily as needed for muscle spasms. 30 tablet 0  . diclofenac (CATAFLAM) 50 MG tablet Take 1 tablet (50 mg total) by mouth 3 (three) times daily. 30 tablet 0  . dorzolamide-timolol (COSOPT) 22.3-6.8 MG/ML ophthalmic solution Place 1 drop into both eyes 2 (two) times daily.    Marland Kitchen lisinopril-hydrochlorothiazide (PRINZIDE,ZESTORETIC) 20-25 MG per tablet TAKE 1 TABLET BY MOUTH DAILY. 90 tablet 1  . metaxalone (SKELAXIN) 800 MG tablet Take 1 tablet (800 mg total) by mouth 3 (three) times daily. 21 tablet 1  .  methylPREDNISolone (MEDROL DOSEPAK) 4 MG TBPK tablet follow package directions start on wed. 21 tablet 0  . metoprolol (TOPROL-XL) 200 MG 24 hr tablet TAKE 1 TABLET BY MOUTH DAILY. 90 tablet 1  . Multiple Vitamin (MULTIVITAMIN) capsule       No current facility-administered medications for this visit.     Review of Systems See HPI     Objective:   Physical Exam Temp(Src) 97.9 F (36.6 C) (Oral)  Ht 5\' 9"  (1.753 m)  Wt 341 lb 9.6 oz (154.949 kg)  BMI 50.42 kg/m2  LMP 09/29/2010 Gen: NAD, alert, cooperative with exam, obese HEENT: NCAT, clear conjunctiva, supple  neck CV: RRR, good S1/S2, no murmur,   Resp: CTABL, no wheezes, non-labored Skin: no rashes, normal turgor  Neuro: no gross deficits.  Psych: good insight, alert and oriented GU: > External: no lesions > Vagina: no blood in vault > Cervix: no lesion; no mucopurulent d/c     Assessment & Plan:   Prediabetes Hx of prediabetes.  - A1c today  - if elevated. May need to start metformin  - she has seen Dr. Jenne Campus before  - she has several social issues going on at home that may interfere with control. Passing of relatives and her husband recently had a stroke.   Health care maintenance Pap done today  Never smoker  Mammogram done in March  She has several social stressors as of late. Her mother passed as well as her brother in law.  Her husband recently had a stroke which is making her back pain worse.  - informed that she can schedule appt with me if she would like to discuss any of this further.  - has seen Dr. Jenne Campus previously. Mentioned Dr. Gwenlyn Saran as an option as well but I will discuss further if patient follows up.   HYPERTENSION, BENIGN SYSTEMIC Elevated but usually controlled per patient.  - continue current regimen for now.  - lab work done today.   Back pain Hx of right sided lumbar radiculopathy but now having left sided  Has been seeing a chiropractor  Taking herbal supplements with some relief.  - continue to monitor for now.  - would encourage to lose weight

## 2014-11-02 NOTE — Assessment & Plan Note (Signed)
Elevated but usually controlled per patient.  - continue current regimen for now.  - lab work done today.

## 2014-11-02 NOTE — Assessment & Plan Note (Signed)
Pap done today  Never smoker  Mammogram done in March  She has several social stressors as of late. Her mother passed as well as her brother in law.  Her husband recently had a stroke which is making her back pain worse.  - informed that she can schedule appt with me if she would like to discuss any of this further.  - has seen Dr. Jenne Campus previously. Mentioned Dr. Gwenlyn Saran as an option as well but I will discuss further if patient follows up.

## 2014-11-04 LAB — CYTOLOGY - PAP

## 2014-11-08 ENCOUNTER — Telehealth: Payer: Self-pay | Admitting: Family Medicine

## 2014-11-08 NOTE — Telephone Encounter (Signed)
Called patient but unable to leave voice mail. If she calls back please have her speak with a nurse/CMA and inform that she is not currently in a statin benefit group based on her cholesterol level. Her other labs are normal or normal for her.  Her pap smear was negative for HPV but she will need to have her pap repeated either this year or next year as the transformation zone was not seen.   If any questions then please take the best time and phone number to call and I will try to call her back.   Rosemarie Ax, MD PGY-3, [facility] 11/08/2014, 12:40 PM

## 2014-11-09 NOTE — Telephone Encounter (Signed)
Spoke with patient aware of results, will repeat pap next year. Wallace Cullens, CMA

## 2015-03-24 DIAGNOSIS — H401131 Primary open-angle glaucoma, bilateral, mild stage: Secondary | ICD-10-CM | POA: Diagnosis not present

## 2015-04-01 ENCOUNTER — Other Ambulatory Visit: Payer: Self-pay

## 2015-04-01 DIAGNOSIS — Z1231 Encounter for screening mammogram for malignant neoplasm of breast: Secondary | ICD-10-CM

## 2015-04-15 ENCOUNTER — Ambulatory Visit
Admission: RE | Admit: 2015-04-15 | Discharge: 2015-04-15 | Disposition: A | Discharge status: Home / Self Care | Payer: 59 | Source: Ambulatory Visit

## 2015-04-15 DIAGNOSIS — Z1231 Encounter for screening mammogram for malignant neoplasm of breast: Secondary | ICD-10-CM

## 2015-04-27 ENCOUNTER — Other Ambulatory Visit: Payer: Self-pay | Admitting: Family Medicine

## 2015-04-28 MED FILL — AMLODIPINE BESYLATE 5 MG TA: 5 | 90 days supply | Qty: 90 | Fill #0

## 2015-04-28 MED FILL — LISINOPRIL-HCTZ 20-25 MG TA: 20-25 | 90 days supply | Qty: 90 | Fill #0

## 2015-04-28 MED FILL — METOPROLOL SUCC ER 200 MG T: 200 | 90 days supply | Qty: 90 | Fill #0

## 2015-08-01 MED FILL — AMLODIPINE BESYLATE 5 MG TA: 5 | 90 days supply | Qty: 90 | Fill #1

## 2015-08-01 MED FILL — METOPROLOL SUCC ER 200 MG T: 200 | 90 days supply | Qty: 90 | Fill #1

## 2015-08-01 MED FILL — LISINOPRIL-HCTZ 20-25 MG TA: 20-25 | 90 days supply | Qty: 90 | Fill #1

## 2015-08-09 ENCOUNTER — Ambulatory Visit (INDEPENDENT_AMBULATORY_CARE_PROVIDER_SITE_OTHER): Payer: 59 | Admitting: Internal Medicine

## 2015-08-09 ENCOUNTER — Encounter: Payer: Self-pay | Admitting: Internal Medicine

## 2015-08-09 VITALS — BP 138/86 | HR 69 | Temp 98.0°F | Ht 69.0 in | Wt 341.0 lb

## 2015-08-09 DIAGNOSIS — I10 Essential (primary) hypertension: Secondary | ICD-10-CM

## 2015-08-09 MED ORDER — METOPROLOL SUCCINATE ER 200 MG PO TB24: 200.0000 mg | ORAL_TABLET | Freq: Every day | ORAL | Status: DC

## 2015-08-09 MED ORDER — AMLODIPINE BESYLATE 5 MG PO TABS: 5.0000 mg | ORAL_TABLET | Freq: Every day | ORAL | Status: DC

## 2015-08-09 MED ORDER — LISINOPRIL-HYDROCHLOROTHIAZIDE 20-25 MG PO TABS: 1.0000 | ORAL_TABLET | Freq: Every day | ORAL | Status: DC

## 2015-08-09 NOTE — Assessment & Plan Note (Signed)
At goal today.  -continue current medication regimen, refills provided  -continue home monitoring  -f/u with PCP in 5-6 months for f/u and repeat lab work

## 2015-08-09 NOTE — Patient Instructions (Signed)
Your blood pressure was at goal on repeat check! Please return in 6 months for follow up of your chronic medical problems and your early labs.   Take Care,   Dr. Juleen China

## 2015-08-09 NOTE — Progress Notes (Signed)
   Subjective:    Misty Maxwell - 50 y.o. female MRN [id number]  Date of birth: 04-Apr-1965  HPI  Misty Maxwell is here for follow up of HTN.  Chronic HTN Disease Monitoring:  Home BP Monitoring - Checks 2-3x/week, last check was 130s/80. Normal range is 130s/75-77.  Chest pain- no  Dyspnea- no Headache - no  Medications: Metoprolol, lisinopril, HCTZ, amlodipine  Compliance- yes Lightheadedness- no  Edema- no   Preventitive Healthcare:  Exercise: yes, reports she has lost 7lbs recently. Has been trying to park car further away.   Diet Pattern: Trying to eat smoothies, more fruits like watermelon. Has stopped eating chicken wings.   Salt Restriction: Does not add any table salt but admits there might be too much salt already in the food.    Health Maintenance Due  Topic Date Due  . HIV Screening  05/02/1980  . COLONOSCOPY  05/03/2015    -  reports that she has never smoked. She does not have any smokeless tobacco history on file. - Review of Systems: Per HPI. - Past Medical History: Patient Active Problem List   Diagnosis Date Noted  . Health care maintenance 09/04/2013  . Prediabetes 05/02/2009  . METABOLIC SYNDROME X [id number]  . KNEE PAIN, BILATERAL 08/11/2007  . LEG PAIN, RIGHT 08/11/2007  . PARESTHESIA 08/11/2007  . Back pain 08/04/2007  . OBESITY, MORBID 03/28/2006  . GLAUCOMA 03/28/2006  . HYPERTENSION, BENIGN SYSTEMIC 03/28/2006  . OSTEOARTHRITIS, LOWER LEG 03/28/2006   - Medications: reviewed and updated    Objective:   Physical Exam BP 138/86 mmHg  Pulse 69  Temp(Src) 98 F (36.7 C) (Oral)  Ht 5\' 9"  (1.753 m)  Wt 341 lb (154.677 kg)  BMI 50.33 kg/m2  LMP 09/29/2010 Gen: NAD, alert, cooperative with exam, well-appearing, morbidly obese  HEENT: NCAT, PERRL, clear conjunctiva, oropharynx clear, supple neck CV: RRR, good S1/S2, no murmur, no LE edema  Resp: CTABL, no wheezes, non-labored Abd: SNTND, BS present, no guarding or  organomegaly Skin: no rashes, normal turgor  Neuro: no gross deficits.  Psych: good insight, alert and oriented     Assessment & Plan:   HYPERTENSION, BENIGN SYSTEMIC At goal today.  -continue current medication regimen, refills provided  -continue home monitoring  -f/u with PCP in 5-6 months for f/u and repeat lab work     Information sheet regarding colonoscopies given today. Patient to schedule to make an appointment. Discussed importance of routine colon CA screening given her age.   Phill Myron, D.O. 08/09/2015, 9:59 AM PGY-2, [facility]

## 2015-08-22 DIAGNOSIS — H401131 Primary open-angle glaucoma, bilateral, mild stage: Secondary | ICD-10-CM | POA: Diagnosis not present

## 2015-11-10 MED FILL — AMLODIPINE BESYLATE 5 MG TA: 5 | 90 days supply | Qty: 90 | Fill #0

## 2015-11-10 MED FILL — LISINOPRIL-HCTZ 20-25 MG TA: 20-25 | 90 days supply | Qty: 90 | Fill #0

## 2015-11-10 MED FILL — METOPROLOL SUCC ER 200 MG T: 200 | 90 days supply | Qty: 90 | Fill #0

## 2015-11-14 DIAGNOSIS — Z0100 Encounter for examination of eyes and vision without abnormal findings: Secondary | ICD-10-CM | POA: Diagnosis not present

## 2015-12-19 MED FILL — SUPREP BOWEL PREP KIT: 17.5-3.13-1 | 1 days supply | Qty: 354 | Fill #0

## 2016-01-05 ENCOUNTER — Encounter (HOSPITAL_COMMUNITY): Payer: Self-pay

## 2016-01-05 ENCOUNTER — Ambulatory Visit (HOSPITAL_COMMUNITY)
Admission: RE | Admit: 2016-01-05 | Discharge: 2016-01-05 | Disposition: A | Discharge status: Home / Self Care | Payer: 59 | Source: Ambulatory Visit | Attending: Gastroenterology | Admitting: Gastroenterology

## 2016-01-05 ENCOUNTER — Encounter (HOSPITAL_COMMUNITY)
Admission: RE | Disposition: A | Discharge status: Home / Self Care | Payer: Self-pay | Source: Ambulatory Visit | Attending: Gastroenterology

## 2016-01-05 ENCOUNTER — Other Ambulatory Visit: Payer: Self-pay | Admitting: Gastroenterology

## 2016-01-05 ENCOUNTER — Ambulatory Visit (HOSPITAL_COMMUNITY): Payer: 59 | Admitting: Anesthesiology

## 2016-01-05 DIAGNOSIS — Z7982 Long term (current) use of aspirin: Secondary | ICD-10-CM | POA: Diagnosis not present

## 2016-01-05 DIAGNOSIS — Z6841 Body Mass Index (BMI) 40.0 and over, adult: Secondary | ICD-10-CM | POA: Insufficient documentation

## 2016-01-05 DIAGNOSIS — E669 Obesity, unspecified: Secondary | ICD-10-CM | POA: Diagnosis not present

## 2016-01-05 DIAGNOSIS — D122 Benign neoplasm of ascending colon: Secondary | ICD-10-CM | POA: Insufficient documentation

## 2016-01-05 DIAGNOSIS — I10 Essential (primary) hypertension: Secondary | ICD-10-CM | POA: Diagnosis not present

## 2016-01-05 DIAGNOSIS — Z79899 Other long term (current) drug therapy: Secondary | ICD-10-CM | POA: Insufficient documentation

## 2016-01-05 DIAGNOSIS — Z1211 Encounter for screening for malignant neoplasm of colon: Secondary | ICD-10-CM | POA: Insufficient documentation

## 2016-01-05 DIAGNOSIS — H409 Unspecified glaucoma: Secondary | ICD-10-CM | POA: Diagnosis not present

## 2016-01-05 DIAGNOSIS — M1990 Unspecified osteoarthritis, unspecified site: Secondary | ICD-10-CM | POA: Diagnosis not present

## 2016-01-05 HISTORY — PX: COLONOSCOPY: SHX5424

## 2016-01-05 SURGERY — COLONOSCOPY
Anesthesia: Monitor Anesthesia Care

## 2016-01-05 MED ORDER — LACTATED RINGERS IV SOLN: INTRAVENOUS | Status: DC

## 2016-01-05 MED ORDER — PROPOFOL 10 MG/ML IV BOLUS
INTRAVENOUS | Status: DC | PRN
  Administered 2016-01-05 (×2): 20 mg via INTRAVENOUS
  Administered 2016-01-05: 10 mg via INTRAVENOUS
  Administered 2016-01-05: 40 mg via INTRAVENOUS
  Administered 2016-01-05 (×2): 20 mg via INTRAVENOUS
  Administered 2016-01-05: 10 mg via INTRAVENOUS
  Administered 2016-01-05 (×2): 20 mg via INTRAVENOUS
  Administered 2016-01-05 (×2): 10 mg via INTRAVENOUS
  Administered 2016-01-05 (×3): 20 mg via INTRAVENOUS
  Administered 2016-01-05: 10 mg via INTRAVENOUS

## 2016-01-05 MED ORDER — SODIUM CHLORIDE 0.9 % IV SOLN: INTRAVENOUS | Status: DC

## 2016-01-05 MED ORDER — PROPOFOL 10 MG/ML IV BOLUS
INTRAVENOUS | Status: AC
  Filled 2016-01-05: qty 40

## 2016-01-05 MED ORDER — LACTATED RINGERS IV SOLN
INTRAVENOUS | Status: DC | PRN
  Administered 2016-01-05: 10:00:00 via INTRAVENOUS

## 2016-01-05 NOTE — Transfer of Care (Signed)
Immediate Anesthesia Transfer of Care Note  Patient: Misty Maxwell  Procedure(s) Performed: Procedure(s): COLONOSCOPY (N/A)  Patient Location: PACU  Anesthesia Type:MAC  Level of Consciousness: sedated  Airway & Oxygen Therapy: Patient Spontanous Breathing and Patient connected to nasal cannula oxygen  Post-op Assessment: Report given to RN and Post -op Vital signs reviewed and stable  Post vital signs: Reviewed and stable  Last Vitals:  Vitals:   01/05/16 0919  BP: (!) 154/73  Pulse: 69  Resp: 14  Temp: 36.8 C    Last Pain:  Vitals:   01/05/16 0919  TempSrc: Oral         Complications: No apparent anesthesia complications

## 2016-01-05 NOTE — H&P (Signed)
[facility] Admission Note  Chief Complaint: colon cancer screening HPI: Misty Maxwell is an 50 y.o. female presenting for colon cancer screening with colonoscopy, BMI over 31.  No GI complaints.  Aunt with history of colonic polyps.  No prior colonoscopy.  Past Medical History:  Diagnosis Date  . Arthritis   . Hypertension   . Impaired glucose tolerance   . Obesity     History reviewed. No pertinent surgical history.  Medications Prior to Admission  Medication Sig Dispense Refill  . amLODipine (NORVASC) 5 MG tablet Take 1 tablet (5 mg total) by mouth daily. 90 tablet 1  . aspirin 81 MG tablet Take 81 mg by mouth daily.    . bimatoprost (LUMIGAN) 0.03 % ophthalmic drops      . Black Cohosh 540 MG CAPS      . Calcium 500-125 MG-UNIT TABS      . cyclobenzaprine (FLEXERIL) 5 MG tablet Take 1 tablet (5 mg total) by mouth 3 (three) times daily as needed for muscle spasms. 30 tablet 0  . dorzolamide-timolol (COSOPT) 22.3-6.8 MG/ML ophthalmic solution Place 1 drop into both eyes 2 (two) times daily.    Marland Kitchen lisinopril-hydrochlorothiazide (PRINZIDE,ZESTORETIC) 20-25 MG tablet Take 1 tablet by mouth daily. 90 tablet 1  . methylPREDNISolone (MEDROL DOSEPAK) 4 MG TBPK tablet follow package directions start on wed. 21 tablet 0  . metoprolol (TOPROL-XL) 200 MG 24 hr tablet Take 1 tablet (200 mg total) by mouth daily. 90 tablet 1  . Multiple Vitamin (MULTIVITAMIN) capsule      . diclofenac (CATAFLAM) 50 MG tablet Take 1 tablet (50 mg total) by mouth 3 (three) times daily. 30 tablet 0  . metaxalone (SKELAXIN) 800 MG tablet Take 1 tablet (800 mg total) by mouth 3 (three) times daily. 21 tablet 1    Allergies: No Known Allergies  Family History  Problem Relation Age of Onset  . Diabetes Father   . Stroke Father   . Aneurysm Father   . Stroke Maternal Grandmother   . Diabetes Paternal Grandmother   . Hypertension Mother     Social History:  reports that she has never smoked. She  has never used smokeless tobacco. She reports that she drinks alcohol. She reports that she does not use drugs.   ROS: As per HPI, all others negative   Blood pressure (!) 154/73, pulse 69, temperature 98.2 F (36.8 C), temperature source Oral, resp. rate 14, height 5\' 8"  (1.727 m), weight (!) 151.5 kg (334 lb), last menstrual period 09/29/2010, SpO2 96 %. General appearance: Obese, NAD Heart:  Regular Lungs:  Clear ABD:  Soft, protuberant, obese, non-tender NEURO:  Alert and Oriented  No results found for this or any previous visit (from the past 65 hour(s)). No results found.  Assessment/Plan  1.  Colon cancer screening. 2.  Colonoscopy. 3.  Risks (bleeding, infection, bowel perforation that could require surgery, sedation-related changes in cardiopulmonary systems), benefits (identification and possible treatment of source of symptoms, exclusion of certain causes of symptoms), and alternatives (watchful waiting, radiographic imaging studies, empiric medical treatment) of colonoscopy were explained to patient/family in detail and patient wishes to proceed.  Landry Dyke 01/05/2016, 10:12 AM

## 2016-01-05 NOTE — Anesthesia Preprocedure Evaluation (Signed)
Anesthesia Evaluation  Patient identified by MRN, date of birth, ID band Patient awake    Reviewed: Allergy & Precautions, NPO status , Patient's Chart, lab work & pertinent test results  Airway Mallampati: III  TM Distance: >3 FB Neck ROM: Full    Dental no notable dental hx.    Pulmonary neg pulmonary ROS,    Pulmonary exam normal breath sounds clear to auscultation       Cardiovascular hypertension, Pt. on medications Normal cardiovascular exam Rhythm:Regular Rate:Normal     Neuro/Psych negative neurological ROS  negative psych ROS   GI/Hepatic negative GI ROS, Neg liver ROS,   Endo/Other  Morbid obesity  Renal/GU negative Renal ROS     Musculoskeletal  (+) Arthritis ,   Abdominal (+) + obese,   Peds  Hematology negative hematology ROS (+)   Anesthesia Other Findings   Reproductive/Obstetrics negative OB ROS                             Anesthesia Physical Anesthesia Plan  ASA: III  Anesthesia Plan: MAC   Post-op Pain Management:    Induction:   Airway Management Planned:   Additional Equipment:   Intra-op Plan:   Post-operative Plan:   Informed Consent: I have reviewed the patients History and Physical, chart, labs and discussed the procedure including the risks, benefits and alternatives for the proposed anesthesia with the patient or authorized representative who has indicated his/her understanding and acceptance.   Dental advisory given  Plan Discussed with: CRNA  Anesthesia Plan Comments:         Anesthesia Quick Evaluation

## 2016-01-05 NOTE — Discharge Instructions (Signed)

## 2016-01-05 NOTE — Anesthesia Postprocedure Evaluation (Signed)
Anesthesia Post Note  Patient: Misty Maxwell  Procedure(s) Performed: Procedure(s) (LRB): COLONOSCOPY (N/A)  Patient location during evaluation: PACU Anesthesia Type: MAC Level of consciousness: awake and alert Pain management: pain level controlled Vital Signs Assessment: post-procedure vital signs reviewed and stable Respiratory status: spontaneous breathing Cardiovascular status: stable Anesthetic complications: no    Last Vitals:  Vitals:   01/05/16 0919 01/05/16 1035  BP: (!) 154/73 118/90  Pulse: 69 67  Resp: 14 (!) 24  Temp: 36.8 C 36.4 C    Last Pain:  Vitals:   01/05/16 1035  TempSrc: Oral                 Nolon Nations

## 2016-01-05 NOTE — Op Note (Signed)
[facility] Patient Name: Misty Maxwell Procedure Date: 01/05/2016 MRN: [id number] Attending MD: Arta Silence , MD Date of Birth: 1965/06/08 CSN: [id number] Age: 50 Admit Type: Outpatient Procedure:                Colonoscopy Indications:              Screening for colorectal malignant neoplasm, This                            is the patient's first colonoscopy Providers:                Arta Silence, MD, Vista Lawman, RN, Ralene Bathe,                            Technician, Girard Alday CRNA, CRNA Referring MD:              Medicines:                Monitored Anesthesia Care Complications:            No immediate complications. Estimated Blood Loss:     Estimated blood loss: none. Procedure:                Pre-Anesthesia Assessment:                           - Prior to the procedure, a History and Physical                            was performed, and patient medications and                            allergies were reviewed. The patient's tolerance of                            previous anesthesia was also reviewed. The risks                            and benefits of the procedure and the sedation                            options and risks were discussed with the patient.                            All questions were answered, and informed consent                            was obtained. Prior Anticoagulants: The patient has                            taken aspirin. ASA Grade Assessment: III - A                            patient with severe systemic disease. After  reviewing the risks and benefits, the patient was                            deemed in satisfactory condition to undergo the                            procedure.                           After obtaining informed consent, the colonoscope                            was passed under direct vision. Throughout the                            procedure, the patient's blood  pressure, pulse, and                            oxygen saturations were monitored continuously. The                            [id number] scope was introduced through                            the anus and advanced to the the cecum, identified                            by appendiceal orifice and ileocecal valve. The                            ileocecal valve, appendiceal orifice, and rectum                            were photographed. The entire colon was examined.                            The colonoscopy was performed without difficulty.                            The patient tolerated the procedure well. The                            quality of the bowel preparation was good. Scope In: 10:18:54 AM Scope Out: 10:29:36 AM Scope Withdrawal Time: 0 hours 8 minutes 50 seconds  Total Procedure Duration: 0 hours 10 minutes 42 seconds  Findings:      The perianal and digital rectal examinations were normal.      A 3 mm polyp was found in the ascending colon. The polyp was sessile.       The polyp was removed with a cold biopsy forceps. Resection and       retrieval were complete.      Colon otherwise normal; no other polyps, masses, vascular ectasias, or       inflammatory changes were seen.      The retroflexed view of the distal rectum and  anal verge was normal and       showed no anal or rectal abnormalities. Impression:               - One 3 mm polyp in the ascending colon, removed                            with a cold biopsy forceps. Resected and retrieved.                           - The distal rectum and anal verge are normal on                            retroflexion view. Moderate Sedation:      None Recommendation:           - Patient has a contact number available for                            emergencies. The signs and symptoms of potential                            delayed complications were discussed with the                            patient. Return to  normal activities tomorrow.                            Written discharge instructions were provided to the                            patient.                           - Discharge patient to home (via wheelchair).                           - Resume previous diet today.                           - Continue present medications.                           - Await pathology results.                           - Repeat colonoscopy in 5-10 years for surveillance                            based on pathology results.                           - Return to GI clinic PRN.                           - Return to referring physician as previously  scheduled. Procedure Code(s):        --- Professional ---                           [phone], Colonoscopy, flexible; with biopsy, single                            or multiple Diagnosis Code(s):        --- Professional ---                           Z12.11, Encounter for screening for malignant                            neoplasm of colon                           D12.2, Benign neoplasm of ascending colon CPT copyright 2016 [employer]. All rights reserved. The codes documented in this report are preliminary and upon coder review may  be revised to meet current compliance requirements. Arta Silence, MD 01/05/2016 10:36:01 AM This report has been signed electronically. Number of Addenda: 0

## 2016-01-06 ENCOUNTER — Ambulatory Visit (HOSPITAL_COMMUNITY): Admit: 2016-01-06 | Payer: 59 | Admitting: Gastroenterology

## 2016-01-06 ENCOUNTER — Encounter (HOSPITAL_COMMUNITY): Payer: Self-pay

## 2016-01-06 ENCOUNTER — Encounter (HOSPITAL_COMMUNITY): Payer: Self-pay | Admitting: Gastroenterology

## 2016-01-06 SURGERY — COLONOSCOPY WITH PROPOFOL
Anesthesia: Monitor Anesthesia Care | Laterality: Left

## 2016-02-29 MED FILL — AMLODIPINE BESYLATE 5 MG TA: 5 | 90 days supply | Qty: 90 | Fill #1

## 2016-02-29 MED FILL — METOPROLOL SUCC ER 200 MG T: 200 | 90 days supply | Qty: 90 | Fill #1

## 2016-02-29 MED FILL — LISINOPRIL-HCTZ 20-25 MG TA: 20-25 | 90 days supply | Qty: 90 | Fill #1

## 2016-03-13 MED FILL — HYDROCODON-APAP 5-325: 5-325 | 3 days supply | Qty: 14 | Fill #0

## 2016-03-13 MED FILL — CHLORHEXIDINE 0.12% RINSE: 0.12 | 17 days supply | Qty: 473 | Fill #0

## 2016-03-20 MED FILL — HYDROCODON-APAP 5-325: 5-325 | 2 days supply | Qty: 14 | Fill #0

## 2016-04-03 DIAGNOSIS — H401131 Primary open-angle glaucoma, bilateral, mild stage: Secondary | ICD-10-CM | POA: Diagnosis not present

## 2016-05-04 ENCOUNTER — Other Ambulatory Visit: Payer: Self-pay | Admitting: Family Medicine

## 2016-05-04 DIAGNOSIS — Z1231 Encounter for screening mammogram for malignant neoplasm of breast: Secondary | ICD-10-CM

## 2016-06-07 ENCOUNTER — Ambulatory Visit (INDEPENDENT_AMBULATORY_CARE_PROVIDER_SITE_OTHER): Payer: 59 | Admitting: Emergency Medicine

## 2016-06-07 ENCOUNTER — Encounter: Payer: Self-pay | Admitting: Emergency Medicine

## 2016-06-07 ENCOUNTER — Other Ambulatory Visit: Payer: Self-pay | Admitting: Family Medicine

## 2016-06-07 VITALS — BP 148/84 | HR 72 | Temp 98.3°F | Resp 18 | Ht 68.0 in | Wt 342.6 lb

## 2016-06-07 DIAGNOSIS — M539 Dorsopathy, unspecified: Secondary | ICD-10-CM | POA: Diagnosis not present

## 2016-06-07 DIAGNOSIS — M5386 Other specified dorsopathies, lumbar region: Secondary | ICD-10-CM

## 2016-06-07 DIAGNOSIS — I10 Essential (primary) hypertension: Secondary | ICD-10-CM

## 2016-06-07 MED ORDER — METOPROLOL SUCCINATE ER 200 MG PO TB24: 200.0000 mg | ORAL_TABLET | Freq: Every day | ORAL | 1 refills | Status: DC

## 2016-06-07 MED ORDER — METHYLPREDNISOLONE 4 MG PO TBPK: ORAL_TABLET | ORAL | 1 refills | Status: DC

## 2016-06-07 MED ORDER — DICLOFENAC SODIUM 75 MG PO TBEC: 75.0000 mg | DELAYED_RELEASE_TABLET | Freq: Two times a day (BID) | ORAL | 0 refills | Status: AC

## 2016-06-07 MED ORDER — METHYLPREDNISOLONE ACETATE 80 MG/ML IJ SUSP
80.0000 mg | Freq: Once | INTRAMUSCULAR | Status: AC
  Administered 2016-06-07: 80 mg via INTRAMUSCULAR

## 2016-06-07 MED ORDER — AMLODIPINE BESYLATE 5 MG PO TABS: 5.0000 mg | ORAL_TABLET | Freq: Every day | ORAL | 1 refills | Status: DC

## 2016-06-07 MED ORDER — LISINOPRIL-HYDROCHLOROTHIAZIDE 20-25 MG PO TABS: 1.0000 | ORAL_TABLET | Freq: Every day | ORAL | 1 refills | Status: DC

## 2016-06-07 MED FILL — METHYLPREDNISOLONE 4 MG TAB: 4 | 6 days supply | Qty: 21 | Fill #0

## 2016-06-07 MED FILL — DICLOFENAC SOD 75 MG TAB EC: 75 | 15 days supply | Qty: 30 | Fill #0

## 2016-06-07 NOTE — Progress Notes (Signed)
Misty Maxwell 51 y.o.   Chief Complaint  Patient presents with  . side pain    right side from hip to toes get numb takes turmeric and has been helping x5days     HISTORY OF PRESENT ILLNESS: This is a 51 y.o. female complaining of pain to right lumbar area radiating to right leg with toe numbness.  Back Pain  This is a recurrent problem. The current episode started in the past 7 days. The problem occurs constantly. The problem has been waxing and waning since onset. The pain is present in the lumbar spine. The pain radiates to the right knee, right thigh and right foot. The pain is at a severity of 5/10. The pain is moderate. The symptoms are aggravated by position. Associated symptoms include leg pain, numbness, paresthesias and tingling. Pertinent negatives include no abdominal pain, bladder incontinence, bowel incontinence, chest pain, dysuria, fever, headaches, paresis, pelvic pain, perianal numbness, weakness or weight loss. Risk factors include obesity.     Prior to Admission medications   Medication Sig Start Date End Date Taking? Authorizing Provider  amLODipine (NORVASC) 5 MG tablet Take 1 tablet (5 mg total) by mouth daily. 08/09/15  Yes Nicolette Bang, DO  aspirin 81 MG tablet Take 81 mg by mouth daily.   Yes [provider]  diclofenac (CATAFLAM) 50 MG tablet Take 1 tablet (50 mg total) by mouth 3 (three) times daily. 06/01/14  Yes Kindl, Nelda Severe, MD  dorzolamide-timolol (COSOPT) 22.3-6.8 MG/ML ophthalmic solution Place 1 drop into both eyes 2 (two) times daily.   Yes [provider]  lisinopril-hydrochlorothiazide (PRINZIDE,ZESTORETIC) 20-25 MG tablet Take 1 tablet by mouth daily. 08/09/15  Yes Nicolette Bang, DO  metoprolol (TOPROL-XL) 200 MG 24 hr tablet Take 1 tablet (200 mg total) by mouth daily. 08/09/15  Yes Nicolette Bang, DO  bimatoprost (LUMIGAN) 0.03 % ophthalmic drops      [provider]  Black Cohosh 540  MG CAPS      [provider]  Calcium 500-125 MG-UNIT TABS      [provider]  cyclobenzaprine (FLEXERIL) 5 MG tablet Take 1 tablet (5 mg total) by mouth 3 (three) times daily as needed for muscle spasms. Patient not taking: Reported on 06/07/2016 05/18/14   Leone Brand, MD  metaxalone (SKELAXIN) 800 MG tablet Take 1 tablet (800 mg total) by mouth 3 (three) times daily. Patient not taking: Reported on 06/07/2016 06/01/14   Billy Fischer, MD  methylPREDNISolone (MEDROL DOSEPAK) 4 MG TBPK tablet follow package directions start on wed. Patient not taking: Reported on 06/07/2016 06/01/14   Billy Fischer, MD  Multiple Vitamin (MULTIVITAMIN) capsule      [provider]    No Known Allergies  Patient Active Problem List   Diagnosis Date Noted  . Health care maintenance 09/04/2013  . Prediabetes 05/02/2009  . METABOLIC SYNDROME X 66/29/4765  . KNEE PAIN, BILATERAL 08/11/2007  . LEG PAIN, RIGHT 08/11/2007  . PARESTHESIA 08/11/2007  . Back pain 08/04/2007  . OBESITY, MORBID 03/28/2006  . GLAUCOMA 03/28/2006  . HYPERTENSION, BENIGN SYSTEMIC 03/28/2006  . OSTEOARTHRITIS, LOWER LEG 03/28/2006    Past Medical History:  Diagnosis Date  . Arthritis   . Hypertension   . Impaired glucose tolerance   . Obesity     Past Surgical History:  Procedure Laterality Date  . COLONOSCOPY N/A 01/05/2016   Procedure: COLONOSCOPY;  Surgeon: Arta Silence, MD;  Location: [facility];  Service: Endoscopy;  Laterality: N/A;    Social History   Social History  . Marital status: Married    Spouse name: N/A  . Number of children: N/A  . Years of education: N/A   Occupational History  . Not on file.   Social History Main Topics  . Smoking status: Never Smoker  . Smokeless tobacco: Never Used  . Alcohol use Yes  . Drug use: No  . Sexual activity: Not on file   Other Topics Concern  . Not on file   Social History Narrative   Lives in [place]. Works at the  hospital.     Family History  Problem Relation Age of Onset  . Diabetes Father   . Stroke Father   . Aneurysm Father   . Stroke Maternal Grandmother   . Diabetes Paternal Grandmother   . Hypertension Mother      Review of Systems  Constitutional: Negative for fever and weight loss.  HENT: Negative.   Eyes: Negative.   Respiratory: Negative for cough and shortness of breath.   Cardiovascular: Negative for chest pain, claudication and leg swelling.  Gastrointestinal: Negative for abdominal pain, blood in stool, bowel incontinence, diarrhea, melena, nausea and vomiting.  Genitourinary: Negative for bladder incontinence, dysuria, hematuria and pelvic pain.  Musculoskeletal: Positive for back pain. Negative for myalgias and neck pain.  Skin: Negative.  Negative for rash.  Neurological: Positive for tingling, numbness and paresthesias. Negative for dizziness, sensory change, focal weakness, weakness and headaches.  Endo/Heme/Allergies: Negative.   All other systems reviewed and are negative.    Vitals:   06/07/16 1644  BP: (!) 148/84  Pulse: 72  Resp: 18  Temp: 98.3 F (36.8 C)     Physical Exam  Constitutional: She is oriented to person, place, and time. She appears well-developed.  Morbidly obese  HENT:  Head: Normocephalic and atraumatic.  Nose: Nose normal.  Mouth/Throat: Oropharynx is clear and moist. No oropharyngeal exudate.  Eyes: Conjunctivae and EOM are normal. Pupils are equal, round, and reactive to light.  Neck: Normal range of motion. Neck supple. No JVD present. No thyromegaly present.  Cardiovascular: Normal rate and regular rhythm.   Pulmonary/Chest: Effort normal and breath sounds normal.  Abdominal: Soft. There is no tenderness.  Musculoskeletal:       Lumbar back: She exhibits decreased range of motion and tenderness. She exhibits no bony tenderness and normal pulse.       Back:  Lymphadenopathy:    She has no cervical adenopathy.  Neurological:  She is alert and oriented to person, place, and time. She displays abnormal reflex. No sensory deficit. She exhibits normal muscle tone. Coordination normal.  Reflex Scores:      Patellar reflexes are 1+ on the right side and 1+ on the left side.      Achilles reflexes are 1+ on the right side and 1+ on the left side. Skin: Skin is warm and dry. Capillary refill takes less than 2 seconds. No rash noted.  Psychiatric: She has a normal mood and affect. Her behavior is normal.  Vitals reviewed.    ASSESSMENT & PLAN: Milley was seen today for side pain.  Diagnoses and all orders for this visit:  Sciatica of right side associated with disorder of lumbar spine -     methylPREDNISolone acetate (DEPO-MEDROL) injection 80 mg; Inject 1 mL (80 mg total) into the muscle once.  Other orders -     diclofenac (VOLTAREN) 75 MG EC tablet; Take 1 tablet (75 mg  total) by mouth 2 (two) times daily. -     methylPREDNISolone (MEDROL DOSEPAK) 4 MG TBPK tablet; Sig as indicated    Patient Instructions       IF you received an x-ray today, you will receive an invoice from [facility]. Please contact [facility] at [phone] with questions or concerns regarding your invoice.   IF you received labwork today, you will receive an invoice from [facility]. Please contact [facility] at [phone] with questions or concerns regarding your invoice.   Our billing staff will not be able to assist you with questions regarding bills from these companies.  You will be contacted with the lab results as soon as they are available. The fastest way to get your results is to activate your My Chart account. Instructions are located on the last page of this paperwork. If you have not heard from [place] regarding the results in 2 weeks, please contact this office.      Sciatica Sciatica is pain, numbness, weakness, or tingling along your sciatic nerve. The sciatic nerve starts in the lower back and goes  down the back of each leg. Sciatica happens when this nerve is pinched or has pressure put on it. Sciatica usually goes away on its own or with treatment. Sometimes, sciatica may keep coming back (recur). Follow these instructions at home: Medicines   Take over-the-counter and prescription medicines only as told by your doctor.  Do not drive or use heavy machinery while taking prescription pain medicine. Managing pain   If directed, put ice on the affected area.  Put ice in a plastic bag.  Place a towel between your skin and the bag.  Leave the ice on for 20 minutes, 2-3 times a day.  After icing, apply heat to the affected area before you exercise or as often as told by your doctor. Use the heat source that your doctor tells you to use, such as a moist heat pack or a heating pad.  Place a towel between your skin and the heat source.  Leave the heat on for 20-30 minutes.  Remove the heat if your skin turns bright red. This is especially important if you are unable to feel pain, heat, or cold. You may have a greater risk of getting burned. Activity   Return to your normal activities as told by your doctor. Ask your doctor what activities are safe for you.  Avoid activities that make your sciatica worse.  Take short rests during the day. Rest in a lying or standing position. This is usually better than sitting to rest.  When you rest for a long time, do some physical activity or stretching between periods of rest.  Avoid sitting for a long time without moving. Get up and move around at least one time each hour.  Exercise and stretch regularly, as told by your doctor.  Do not lift anything that is heavier than 10 lb (4.5 kg) while you have symptoms of sciatica.  Avoid lifting heavy things even when you do not have symptoms.  Avoid lifting heavy things over and over.  When you lift objects, always lift in a way that is safe for your body. To do this, you should:  Bend your  knees.  Keep the object close to your body.  Avoid twisting. General instructions   Use good posture.  Avoid leaning forward when you are sitting.  Avoid hunching over when you are standing.  Stay at a healthy weight.  Wear comfortable shoes  that support your feet. Avoid wearing high heels.  Avoid sleeping on a mattress that is too soft or too hard. You might have less pain if you sleep on a mattress that is firm enough to support your back.  Keep all follow-up visits as told by your doctor. This is important. Contact a doctor if:  You have pain that:  Wakes you up when you are sleeping.  Gets worse when you lie down.  Is worse than the pain you have had in the past.  Lasts longer than 4 weeks.  You lose weight for without trying. Get help right away if:  You cannot control when you pee (urinate) or poop (have a bowel movement).  You have weakness in any of these areas and it gets worse.  Lower back.  Lower belly (pelvis).  Butt (buttocks).  Legs.  You have redness or swelling of your back.  You have a burning feeling when you pee. This information is not intended to replace advice given to you by your health care provider. Make sure you discuss any questions you have with your health care provider. Document Released: 10/25/2007 Document Revised: 06/23/2015 Document Reviewed: 09/24/2014 Elsevier Interactive Patient Education  2017 Elsevier Inc.      Agustina Caroli, MD [facility]

## 2016-06-07 NOTE — Patient Instructions (Addendum)
IF you received an x-ray today, you will receive an invoice from [facility] Rad[facility]. Please contact [facility] at [phone] with questions or concerns regarding your invoice.   IF you received labwork today, you will receive an invoice from [facility]. Please contact [facility] at [phone] with questions or concerns regarding your invoice.   Our billing staff will not be able to assist you with questions regarding bills from these companies.  You will be contacted with the lab results as soon as they are available. The fastest way to get your results is to activate your My Chart account. Instructions are located on the last page of this paperwork. If you have not heard from [place] regarding the results in 2 weeks, please contact this office.      Sciatica Sciatica is pain, numbness, weakness, or tingling along your sciatic nerve. The sciatic nerve starts in the lower back and goes down the back of each leg. Sciatica happens when this nerve is pinched or has pressure put on it. Sciatica usually goes away on its own or with treatment. Sometimes, sciatica may keep coming back (recur). Follow these instructions at home: Medicines   Take over-the-counter and prescription medicines only as told by your doctor.  Do not drive or use heavy machinery while taking prescription pain medicine. Managing pain   If directed, put ice on the affected area.  Put ice in a plastic bag.  Place a towel between your skin and the bag.  Leave the ice on for 20 minutes, 2-3 times a day.  After icing, apply heat to the affected area before you exercise or as often as told by your doctor. Use the heat source that your doctor tells you to use, such as a moist heat pack or a heating pad.  Place a towel between your skin and the heat source.  Leave the heat on for 20-30 minutes.  Remove the heat if your skin turns bright red. This is especially important if you are unable to feel pain, heat, or  cold. You may have a greater risk of getting burned. Activity   Return to your normal activities as told by your doctor. Ask your doctor what activities are safe for you.  Avoid activities that make your sciatica worse.  Take short rests during the day. Rest in a lying or standing position. This is usually better than sitting to rest.  When you rest for a long time, do some physical activity or stretching between periods of rest.  Avoid sitting for a long time without moving. Get up and move around at least one time each hour.  Exercise and stretch regularly, as told by your doctor.  Do not lift anything that is heavier than 10 lb (4.5 kg) while you have symptoms of sciatica.  Avoid lifting heavy things even when you do not have symptoms.  Avoid lifting heavy things over and over.  When you lift objects, always lift in a way that is safe for your body. To do this, you should:  Bend your knees.  Keep the object close to your body.  Avoid twisting. General instructions   Use good posture.  Avoid leaning forward when you are sitting.  Avoid hunching over when you are standing.  Stay at a healthy weight.  Wear comfortable shoes that support your feet. Avoid wearing high heels.  Avoid sleeping on a mattress that is too soft or too hard. You might have less pain if you sleep on a  mattress that is firm enough to support your back.  Keep all follow-up visits as told by your doctor. This is important. Contact a doctor if:  You have pain that:  Wakes you up when you are sleeping.  Gets worse when you lie down.  Is worse than the pain you have had in the past.  Lasts longer than 4 weeks.  You lose weight for without trying. Get help right away if:  You cannot control when you pee (urinate) or poop (have a bowel movement).  You have weakness in any of these areas and it gets worse.  Lower back.  Lower belly (pelvis).  Butt (buttocks).  Legs.  You have redness  or swelling of your back.  You have a burning feeling when you pee. This information is not intended to replace advice given to you by your health care provider. Make sure you discuss any questions you have with your health care provider. Document Released: 10/25/2007 Document Revised: 06/23/2015 Document Reviewed: 09/24/2014 Elsevier Interactive Patient Education  2017 [employer].

## 2016-06-07 NOTE — Telephone Encounter (Signed)
Pt calling to request refill of:  Name of Medication(s):   amlodipine, lisinopril-hydrochlorothiazide, and metoprolol.  Last date of OV: 08-09-15 [facility]   Will route refill request to Clinic RN.  Discussed with patient policy to call pharmacy for future refills.  Also, discussed refills may take up to 48 hours to approve or deny.  Misty Maxwell

## 2016-06-08 MED FILL — METOPROLOL SUCC ER 200 MG T: 200 | 90 days supply | Qty: 90 | Fill #0

## 2016-06-08 MED FILL — AMLODIPINE BESYLATE 5 MG TA: 5 | 90 days supply | Qty: 90 | Fill #0

## 2016-06-08 MED FILL — LISINOPRIL-HCTZ 20-25 MG TA: 20-25 | 90 days supply | Qty: 90 | Fill #0

## 2016-06-11 ENCOUNTER — Ambulatory Visit
Admission: RE | Admit: 2016-06-11 | Discharge: 2016-06-11 | Disposition: A | Discharge status: Home / Self Care | Payer: 59 | Source: Ambulatory Visit | Attending: Family Medicine | Admitting: Family Medicine

## 2016-06-11 DIAGNOSIS — Z1231 Encounter for screening mammogram for malignant neoplasm of breast: Secondary | ICD-10-CM | POA: Diagnosis not present

## 2016-06-28 MED FILL — DORZOLAMIDE-TIMOLOL EYE DRP: 22.3-6.8 | 50 days supply | Qty: 10 | Fill #0

## 2016-07-01 ENCOUNTER — Encounter (HOSPITAL_COMMUNITY): Payer: Self-pay | Admitting: Emergency Medicine

## 2016-07-01 ENCOUNTER — Ambulatory Visit (HOSPITAL_COMMUNITY)
Admission: EM | Admit: 2016-07-01 | Discharge: 2016-07-01 | Disposition: A | Discharge status: Home / Self Care | Payer: 59 | Attending: Internal Medicine | Admitting: Internal Medicine

## 2016-07-01 DIAGNOSIS — M5431 Sciatica, right side: Secondary | ICD-10-CM

## 2016-07-01 MED ORDER — METHYLPREDNISOLONE 4 MG PO TBPK: ORAL_TABLET | ORAL | 0 refills | Status: DC

## 2016-07-01 MED ORDER — CYCLOBENZAPRINE HCL 10 MG PO TABS: 10.0000 mg | ORAL_TABLET | Freq: Two times a day (BID) | ORAL | 0 refills | Status: DC | PRN

## 2016-07-01 NOTE — ED Triage Notes (Signed)
The patient presented to the [facility] with a complaint of back pain that she believed to be due to moving furniture today. The patient reported a hx of sciatica.

## 2016-07-01 NOTE — ED Provider Notes (Signed)
CSN: [id number]     Arrival date & time 07/01/16  1831 History   None    Chief Complaint  Patient presents with  . Back Pain   (Consider location/radiation/quality/duration/timing/severity/associated sxs/prior Treatment) Patient c/o back pain from moving today.  She c/o right back pain radiating down to her right knee.  She states she was just getting over right sciatica.   The history is provided by the patient.  Back Pain  Location:  Lumbar spine Quality:  Aching Radiates to:  R thigh Pain severity:  Moderate Pain is:  Worse during the day Onset quality:  Sudden Duration:  1 day Timing:  Constant Chronicity:  New Relieved by:  Nothing Worsened by:  Nothing   Past Medical History:  Diagnosis Date  . Arthritis   . Hypertension   . Impaired glucose tolerance   . Obesity    Past Surgical History:  Procedure Laterality Date  . COLONOSCOPY N/A 01/05/2016   Procedure: COLONOSCOPY;  Surgeon: Arta Silence, MD;  Location: [facility];  Service: Endoscopy;  Laterality: N/A;   Family History  Problem Relation Age of Onset  . Diabetes Father   . Stroke Father   . Aneurysm Father   . Stroke Maternal Grandmother   . Diabetes Paternal Grandmother   . Hypertension Mother    Social History  Substance Use Topics  . Smoking status: Never Smoker  . Smokeless tobacco: Never Used  . Alcohol use Yes   OB History    No data available     Review of Systems  Constitutional: Negative.   HENT: Negative.   Eyes: Negative.   Respiratory: Negative.   Cardiovascular: Negative.   Gastrointestinal: Negative.   Endocrine: Negative.   Genitourinary: Negative.   Musculoskeletal: Positive for back pain.  Allergic/Immunologic: Negative.   Neurological: Negative.   Hematological: Negative.   Psychiatric/Behavioral: Negative.     Allergies  Patient has no known allergies.  Home Medications   Prior to Admission medications   Medication Sig Start Date End Date Taking?  Authorizing Provider  amLODipine (NORVASC) 5 MG tablet Take 1 tablet (5 mg total) by mouth daily. 06/07/16   Carlyle Dolly, MD  aspirin 81 MG tablet Take 81 mg by mouth daily.    [provider]  bimatoprost (LUMIGAN) 0.03 % ophthalmic drops      [provider]  Black Cohosh 540 MG CAPS      [provider]  Calcium 500-125 MG-UNIT TABS      [provider]  cyclobenzaprine (FLEXERIL) 10 MG tablet Take 1 tablet (10 mg total) by mouth 2 (two) times daily as needed for muscle spasms. 07/01/16   Lysbeth Penner, FNP  diclofenac (CATAFLAM) 50 MG tablet Take 1 tablet (50 mg total) by mouth 3 (three) times daily. 06/01/14   Billy Fischer, MD  dorzolamide-timolol (COSOPT) 22.3-6.8 MG/ML ophthalmic solution Place 1 drop into both eyes 2 (two) times daily.    [provider]  lisinopril-hydrochlorothiazide (PRINZIDE,ZESTORETIC) 20-25 MG tablet Take 1 tablet by mouth daily. 06/07/16   Carlyle Dolly, MD  methylPREDNISolone (MEDROL DOSEPAK) 4 MG TBPK tablet Take 6-5-4-3-2-1 po qd 07/01/16   Lysbeth Penner, FNP  metoprolol (TOPROL-XL) 200 MG 24 hr tablet Take 1 tablet (200 mg total) by mouth daily. 06/07/16   Carlyle Dolly, MD  Multiple Vitamin (MULTIVITAMIN) capsule      [provider]   Meds Ordered and Administered this Visit  Medications - No data to display  BP (!) 169/84 (BP Location: Right Arm)   Pulse 78   Temp 98.4 F (36.9 C) (Oral)   Resp 20   LMP 09/29/2010   SpO2 96%  No data found.   Physical Exam  Constitutional: She appears well-developed and well-nourished.  HENT:  Head: Normocephalic and atraumatic.  Eyes: Conjunctivae and EOM are normal. Pupils are equal, round, and reactive to light.  Neck: Normal range of motion. Neck supple.  Cardiovascular: Normal rate, regular rhythm and normal heart sounds.   Pulmonary/Chest: Effort normal.  Musculoskeletal: She exhibits tenderness.  TTP right lumbar spine   Nursing note and vitals reviewed.   Urgent Care Course     Procedures (including critical care time)  Labs Review Labs Reviewed - No data to display  Imaging Review No results found.   Visual Acuity Review  Right Eye Distance:   Left Eye Distance:   Bilateral Distance:    Right Eye Near:   Left Eye Near:    Bilateral Near:         MDM   1. Sciatica of right side    Medrol dose pack Cyclobenzaprine      Lysbeth Penner, FNP 07/01/16 2138

## 2016-07-04 ENCOUNTER — Telehealth: Payer: Self-pay | Admitting: Family Medicine

## 2016-07-04 NOTE — Progress Notes (Signed)
Subjective:    Patient ID: Misty Maxwell , female   DOB: 12/11/1965 , 51 y.o..   MRN: [id number]  HPI  Misty Maxwell is here for  Chief Complaint  Patient presents with  . sciatic nerve pain    1. Acute Lumbar Back pain: Has hx of lumbar back pain with bilateral radiculopathy. Dating back until at least 2009 based on Epic review. She was recently seen on 6/3 seen at urgent care after moving furniture and was given rx for 10 mg of Flexeril BID PRN and a 7 day steroid taper. Taking steroids and flexeril without relief. Even tried a dose of hydrocodone that she had around the house and that didn't help.  Pain is described as . Patient has tried Flexeril and steroids as above. Pain radiates down right leg. History of trauma or injury: No Prior history of similar pain: Yes History of cancer: No Weak immune system:  No History of IV drug use: No History of steroid use: Only in the last week Previous imaging: Lumbar spine xray in Oct 2011 showing Lower lumbar spine facet arthropathy  Symptoms Incontinence of bowel or bladder:  No Numbness of leg: No but does have some numbness on her right foot  Fever: No Rest or Night pain: No Weight Loss:  No Rash: No  Patient believes moving her furniture might be causing their pain.  ROS see HPI Smoking Status noted.  Past Medical History: Patient Active Problem List   Diagnosis Date Noted  . Health care maintenance 09/04/2013  . Prediabetes 05/02/2009  . METABOLIC SYNDROME X 73/53/2992  . KNEE PAIN, BILATERAL 08/11/2007  . LEG PAIN, RIGHT 08/11/2007  . PARESTHESIA 08/11/2007  . Back pain 08/04/2007  . OBESITY, MORBID 03/28/2006  . GLAUCOMA 03/28/2006  . HYPERTENSION, BENIGN SYSTEMIC 03/28/2006  . OSTEOARTHRITIS, LOWER LEG 03/28/2006    Medications: reviewed  Social Hx:  reports that she has never smoked. She has never used smokeless tobacco.   Objective:   BP (!) 138/104   Pulse 75   Temp 97.9 F (36.6 C) (Oral)    Ht 5\' 8"  (1.727 m)   Wt (!) 342 lb 9.6 oz (155.4 kg)   LMP 09/29/2010   SpO2 98%   BMI 52.09 kg/m  Physical Exam  Gen: NAD, alert, cooperative with exam, in pain, obese Pulm: Normal work of breathing  MSK Back Exam: (limited exam as patient cannot stand without pain, exam done with patient in the chair) Inspection: Unremarkable  Palpable tenderness: Tenderness to palpation on right lumbar paraspinally  Range of Motion: Limited Flexion, Extension, and Side Bending secondary to pain Leg strength: Quad: 5/5 Hamstring: 5/5 Hip flexor: 4/5 Hip abductors: 4/5  Strength at foot: Plantar-flexion: 5/5 Dorsi-flexion: 5/5 Eversion: 5/5 Inversion: 5/5  Sensory change: Gross sensation intact to in legs Reflexes:unable to test as patient sitting in chair with feet on the floor Gait antalgic, using walker  Assessment & Plan:  Back pain Hx of intermittent lumbar back pain with radiculopathy. Was gone for years but returned last week after moving furniture. Exam limited secondary to body habitus and pain but no red flag exam findings. Likely muscle strain vs herniated disc. S/p 7 day prednisone pack and muscle relaxer in ED on 6/3. Discussed patient with Dr. Erin Hearing.  - Toradol shot provided in clinic today - Start 800 mg Ibuprofen q 8 hrs PRN tomorrow - Begin Gabapentin 100 mg TID  - Return to clinic in 2 weeks if pain is not  improved at all or worse - Consider back imaging if worsening symptoms - Red flag symptoms and reasons to go to the ED discussed - Letter written for work excuse   Meds ordered this encounter  Medications  . DISCONTD: ketorolac (TORADOL) 30 MG/ML injection 30 mg  . ketorolac (TORADOL) 30 MG/ML injection 30 mg  . gabapentin (NEURONTIN) 100 MG capsule    Sig: Take 1 capsule (100 mg total) by mouth 3 (three) times daily.    Dispense:  90 capsule    Refill:  3  . ibuprofen (ADVIL,MOTRIN) 800 MG tablet    Sig: Take 1 tablet (800 mg total) by mouth every 8 (eight) hours as  needed for moderate pain.    Dispense:  90 tablet    Refill:  0    Smitty Cords, MD [place], PGY-2

## 2016-07-04 NOTE — Telephone Encounter (Signed)
Pre-visit call. Patient confirmed apt for 07/05/16. Patient was advised to arrive early for check-in and to bring all current medications. Patient expressed concerns about sciatic nerve pain and recent visit to Urgent Care.

## 2016-07-05 ENCOUNTER — Ambulatory Visit (INDEPENDENT_AMBULATORY_CARE_PROVIDER_SITE_OTHER): Payer: 59 | Admitting: Family Medicine

## 2016-07-05 ENCOUNTER — Encounter: Payer: Self-pay | Admitting: Family Medicine

## 2016-07-05 ENCOUNTER — Ambulatory Visit: Payer: 59 | Admitting: Family Medicine

## 2016-07-05 DIAGNOSIS — M5441 Lumbago with sciatica, right side: Secondary | ICD-10-CM

## 2016-07-05 MED ORDER — IBUPROFEN 800 MG PO TABS: 800.0000 mg | ORAL_TABLET | Freq: Three times a day (TID) | ORAL | 0 refills | Status: DC | PRN

## 2016-07-05 MED ORDER — KETOROLAC TROMETHAMINE 30 MG/ML IJ SOLN
30.0000 mg | Freq: Once | INTRAMUSCULAR | Status: AC
  Administered 2016-07-05: 30 mg via INTRAMUSCULAR

## 2016-07-05 MED ORDER — GABAPENTIN 100 MG PO CAPS: 100.0000 mg | ORAL_CAPSULE | Freq: Three times a day (TID) | ORAL | 3 refills | Status: DC

## 2016-07-05 MED ORDER — KETOROLAC TROMETHAMINE 30 MG/ML IJ SOLN: 30.0000 mg | Freq: Once | INTRAMUSCULAR | Status: DC

## 2016-07-05 MED FILL — GABAPENTIN 100 MG CAPSULE: 100 | 30 days supply | Qty: 90 | Fill #0

## 2016-07-05 MED FILL — IBUPROFEN 800 MG TAB: 800 | 30 days supply | Qty: 90 | Fill #0

## 2016-07-05 NOTE — Patient Instructions (Addendum)
Thank you for coming in today, it was so nice to see you! Today we talked about:    Back Pain: This may be a severe muscle strain or herniated disc. Both of these can be very painful. We gave you a Toradol shot today to help calm down the inflammation. I would like for you to continue taking the muscle relaxer. I have added 2 medications to help with the pain; Gabapentin and high dose Ibuprofen. Start Gabapentin today. Start Ibuprofen tomorrow. Your pain can last up to 6 weeks but you should start feeling a little better in the next 2-3 weeks  We will hold off on back imaging for now but if your pain does not get better or gets worse we will get a picture of your back  Reasons to go to the hospital: If you can't move at all, the pain gets worse, you lose control of your bowels or bladder, you have numbness in your private area, or if your legs get weak.   Please follow up in 2 weeks if your pain is the same or no improvement.   If you have any questions or concerns, please do not hesitate to call the office at [phone]. You can also message me directly via MyChart.   Sincerely,  Smitty Cords, MD    Back Pain, Adult Back pain is very common. The pain often gets better over time. The cause of back pain is usually not dangerous. Most people can learn to manage their back pain on their own. Follow these instructions at home: Watch your back pain for any changes. The following actions may help to lessen any pain you are feeling:  Stay active. Start with short walks on flat ground if you can. Try to walk farther each day.  Exercise regularly as told by your doctor. Exercise helps your back heal faster. It also helps avoid future injury by keeping your muscles strong and flexible.  Do not sit, drive, or stand in one place for more than 30 minutes.  Do not stay in bed. Resting more than 1-2 days can slow down your recovery.  Be careful when you bend or lift an object. Use good form  when lifting: ? Bend at your knees. ? Keep the object close to your body. ? Do not twist.  Sleep on a firm mattress. Lie on your side, and bend your knees. If you lie on your back, put a pillow under your knees.  Take medicines only as told by your doctor.  Put ice on the injured area. ? Put ice in a plastic bag. ? Place a towel between your skin and the bag. ? Leave the ice on for 20 minutes, 2-3 times a day for the first 2-3 days. After that, you can switch between ice and heat packs.  Avoid feeling anxious or stressed. Find good ways to deal with stress, such as exercise.  Maintain a healthy weight. Extra weight puts stress on your back.  Contact a doctor if:  You have pain that does not go away with rest or medicine.  You have worsening pain that goes down into your legs or buttocks.  You have pain that does not get better in one week.  You have pain at night.  You lose weight.  You have a fever or chills. Get help right away if:  You cannot control when you poop (bowel movement) or pee (urinate).  Your arms or legs feel weak.  Your arms or legs  lose feeling (numbness).  You feel sick to your stomach (nauseous) or throw up (vomit).  You have belly (abdominal) pain.  You feel like you may pass out (faint). This information is not intended to replace advice given to you by your health care provider. Make sure you discuss any questions you have with your health care provider. Document Released: 07/04/2007 Document Revised: 06/23/2015 Document Reviewed: 05/19/2013 Elsevier Interactive Patient Education  [employer].

## 2016-07-05 NOTE — Assessment & Plan Note (Addendum)
Hx of intermittent lumbar back pain with radiculopathy. Was gone for years but returned last week after moving furniture. Exam limited secondary to body habitus and pain but no red flag exam findings. Likely muscle strain vs herniated disc. S/p 7 day prednisone pack and muscle relaxer in ED on 6/3. Discussed patient with Dr. Erin Hearing.  - Toradol shot provided in clinic today - Start 800 mg Ibuprofen q 8 hrs PRN tomorrow - Begin Gabapentin 100 mg TID  - Return to clinic in 2 weeks if pain is not improved at all or worse - Consider back imaging if worsening symptoms - Red flag symptoms and reasons to go to the ED discussed - Letter written for work excuse

## 2016-07-07 ENCOUNTER — Encounter: Payer: Self-pay | Admitting: Emergency Medicine

## 2016-07-07 ENCOUNTER — Ambulatory Visit (INDEPENDENT_AMBULATORY_CARE_PROVIDER_SITE_OTHER): Payer: 59

## 2016-07-07 ENCOUNTER — Ambulatory Visit (INDEPENDENT_AMBULATORY_CARE_PROVIDER_SITE_OTHER): Payer: 59 | Admitting: Emergency Medicine

## 2016-07-07 VITALS — BP 132/88 | HR 59 | Temp 98.4°F | Resp 16 | Ht 69.0 in | Wt 326.0 lb

## 2016-07-07 DIAGNOSIS — G8929 Other chronic pain: Secondary | ICD-10-CM | POA: Diagnosis not present

## 2016-07-07 DIAGNOSIS — M5441 Lumbago with sciatica, right side: Secondary | ICD-10-CM | POA: Diagnosis not present

## 2016-07-07 DIAGNOSIS — M545 Low back pain: Secondary | ICD-10-CM | POA: Diagnosis not present

## 2016-07-07 MED ORDER — METHYLPREDNISOLONE 4 MG PO TBPK: ORAL_TABLET | ORAL | 1 refills | Status: DC

## 2016-07-07 MED ORDER — METHYLPREDNISOLONE ACETATE 80 MG/ML IJ SUSP
80.0000 mg | Freq: Once | INTRAMUSCULAR | Status: AC
  Administered 2016-07-07: 80 mg via INTRAMUSCULAR

## 2016-07-07 NOTE — Progress Notes (Signed)
Misty Maxwell 51 y.o.   Chief Complaint  Patient presents with  . Back Pain    LBP, numbness low right leg and toes since 5 days ago    HISTORY OF PRESENT ILLNESS: This is a 51 y.o. female complaining of right sided low back pain x several days. Denies trauma.  Back Pain  This is a recurrent problem. The current episode started in the past 7 days. The problem occurs intermittently. The problem has been gradually worsening since onset. The pain is present in the lumbar spine. The pain is at a severity of 7/10. The pain is moderate. The symptoms are aggravated by position, bending and twisting. Associated symptoms include leg pain, numbness and tingling. Pertinent negatives include no abdominal pain, bladder incontinence, bowel incontinence, chest pain, dysuria, fever, headaches, pelvic pain, perianal numbness, weakness or weight loss. Risk factors: none.     Prior to Admission medications   Medication Sig Start Date End Date Taking? Authorizing Provider  amLODipine (NORVASC) 5 MG tablet Take 1 tablet (5 mg total) by mouth daily. 06/07/16  Yes Carlyle Dolly, MD  aspirin 81 MG tablet Take 81 mg by mouth daily.   Yes [provider]  bimatoprost (LUMIGAN) 0.03 % ophthalmic drops     Yes [provider]  cyclobenzaprine (FLEXERIL) 10 MG tablet Take 1 tablet (10 mg total) by mouth 2 (two) times daily as needed for muscle spasms. 07/01/16  Yes Lysbeth Penner, FNP  dorzolamide-timolol (COSOPT) 22.3-6.8 MG/ML ophthalmic solution Place 1 drop into both eyes 2 (two) times daily.   Yes [provider]  gabapentin (NEURONTIN) 100 MG capsule Take 1 capsule (100 mg total) by mouth 3 (three) times daily. 07/05/16  Yes Carlyle Dolly, MD  ibuprofen (ADVIL,MOTRIN) 800 MG tablet Take 1 tablet (800 mg total) by mouth every 8 (eight) hours as needed for moderate pain. 07/05/16  Yes Carlyle Dolly, MD  lisinopril-hydrochlorothiazide (PRINZIDE,ZESTORETIC) 20-25 MG  tablet Take 1 tablet by mouth daily. 06/07/16  Yes Carlyle Dolly, MD  metoprolol (TOPROL-XL) 200 MG 24 hr tablet Take 1 tablet (200 mg total) by mouth daily. 06/07/16  Yes Carlyle Dolly, MD  Multiple Vitamin (MULTIVITAMIN) capsule     Yes [provider]    No Known Allergies  Patient Active Problem List   Diagnosis Date Noted  . Health care maintenance 09/04/2013  . Prediabetes 05/02/2009  . METABOLIC SYNDROME X 16/10/9602  . KNEE PAIN, BILATERAL 08/11/2007  . LEG PAIN, RIGHT 08/11/2007  . PARESTHESIA 08/11/2007  . Back pain 08/04/2007  . OBESITY, MORBID 03/28/2006  . GLAUCOMA 03/28/2006  . HYPERTENSION, BENIGN SYSTEMIC 03/28/2006  . OSTEOARTHRITIS, LOWER LEG 03/28/2006    Past Medical History:  Diagnosis Date  . Arthritis   . Hypertension   . Impaired glucose tolerance   . Obesity     Past Surgical History:  Procedure Laterality Date  . COLONOSCOPY N/A 01/05/2016   Procedure: COLONOSCOPY;  Surgeon: Arta Silence, MD;  Location: [facility];  Service: Endoscopy;  Laterality: N/A;    Social History   Social History  . Marital status: Married    Spouse name: N/A  . Number of children: N/A  . Years of education: N/A   Occupational History  . Not on file.   Social History Main Topics  . Smoking status: Never Smoker  . Smokeless tobacco: Never Used  . Alcohol use Yes  . Drug use: No  . Sexual activity: Not on file  Other Topics Concern  . 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     Family History  Problem Relation Age of Onset  . Diabetes Father   . Stroke Father   . Aneurysm Father   . Stroke Maternal Grandmother   . Diabetes Paternal Grandmother   . Hypertension Mother      Review of Systems  Constitutional: Negative for chills, fever, malaise/fatigue and weight loss.  HENT: Negative.   Eyes: Negative.   Respiratory: Negative for cough and shortness of breath.     Cardiovascular: Negative for chest pain, palpitations and leg swelling.  Gastrointestinal: Negative for abdominal pain, bowel incontinence, diarrhea, nausea and vomiting.  Genitourinary: Negative for bladder incontinence, dysuria, frequency, pelvic pain and urgency.  Musculoskeletal: Positive for back pain. Negative for joint pain and neck pain.  Skin: Negative for rash.  Neurological: Positive for tingling and numbness. Negative for dizziness, sensory change, focal weakness, weakness and headaches.  Endo/Heme/Allergies: Negative.   All other systems reviewed and are negative.  Vitals:   07/07/16 1326  BP: (!) 143/88  Pulse: (!) 59  Resp: 16  Temp: 98.4 F (36.9 C)     Physical Exam  Constitutional: She is oriented to person, place, and time. She appears well-developed.  Morbidly obese  HENT:  Head: Normocephalic.  Eyes: Conjunctivae and EOM are normal. Pupils are equal, round, and reactive to light.  Neck: Normal range of motion. Neck supple.  Cardiovascular: Normal rate, regular rhythm and normal heart sounds.   Pulmonary/Chest: Effort normal and breath sounds normal.  Abdominal: Soft. There is no tenderness.  Musculoskeletal:       Lumbar back: She exhibits decreased range of motion, tenderness, pain and spasm. She exhibits no bony tenderness.       Back:  Neurological: She is alert and oriented to person, place, and time.  Skin: Skin is warm and dry. Capillary refill takes less than 2 seconds. No rash noted.  Psychiatric: She has a normal mood and affect.  Vitals reviewed.  Dg Lumbar Spine 2-3 Views  Result Date: 07/07/2016 CLINICAL DATA:  Acute right-sided low back pain with right-sided sciatica EXAM: LUMBAR SPINE - 2-3 VIEW COMPARISON:  11/18/2009 FINDINGS: Mild degenerative sclerosis of the bilateral sacroiliac joints. Maintenance of vertebral body height. Trace L4-5 anterolisthesis is likely degenerative. Degenerate disc disease involves this level. Facet arthropathy  at L4-5 and L5-S1. IMPRESSION: Lumbosacral spondylosis, without acute osseous abnormality. Electronically Signed   By: Abigail Miyamoto M.D.   On: 07/07/2016 14:19     ASSESSMENT & PLAN: Misty Maxwell was seen today for back pain.  Diagnoses and all orders for this visit:  Acute right-sided low back pain with right-sided sciatica -     DG Lumbar Spine 2-3 Views; Future -     methylPREDNISolone acetate (DEPO-MEDROL) injection 80 mg; Inject 1 mL (80 mg total) into the muscle once. -     methylPREDNISolone (MEDROL DOSEPAK) 4 MG TBPK tablet; Sig as indicated -     Ambulatory referral to Orthopedic Surgery  Chronic right-sided low back pain with right-sided sciatica    Patient Instructions       IF you received an x-ray today, you will receive an invoice from [facility]. Please contact [facility] at [phone] with questions or concerns regarding your invoice.   IF you received labwork today, you will receive an invoice from [facility]. Please contact [facility] at [phone] with questions or concerns regarding your  invoice.   Our billing staff will not be able to assist you with questions regarding bills from these companies.  You will be contacted with the lab results as soon as they are available. The fastest way to get your results is to activate your My Chart account. Instructions are located on the last page of this paperwork. If you have not heard from [place] regarding the results in 2 weeks, please contact this office.      Sciatica Sciatica is pain, numbness, weakness, or tingling along your sciatic nerve. The sciatic nerve starts in the lower back and goes down the back of each leg. Sciatica happens when this nerve is pinched or has pressure put on it. Sciatica usually goes away on its own or with treatment. Sometimes, sciatica may keep coming back (recur). Follow these instructions at home: Medicines  Take over-the-counter and prescription medicines only as told  by your doctor.  Do not drive or use heavy machinery while taking prescription pain medicine. Managing pain  If directed, put ice on the affected area. ? Put ice in a plastic bag. ? Place a towel between your skin and the bag. ? Leave the ice on for 20 minutes, 2-3 times a day.  After icing, apply heat to the affected area before you exercise or as often as told by your doctor. Use the heat source that your doctor tells you to use, such as a moist heat pack or a heating pad. ? Place a towel between your skin and the heat source. ? Leave the heat on for 20-30 minutes. ? Remove the heat if your skin turns bright red. This is especially important if you are unable to feel pain, heat, or cold. You may have a greater risk of getting burned. Activity  Return to your normal activities as told by your doctor. Ask your doctor what activities are safe for you. ? Avoid activities that make your sciatica worse.  Take short rests during the day. Rest in a lying or standing position. This is usually better than sitting to rest. ? When you rest for a long time, do some physical activity or stretching between periods of rest. ? Avoid sitting for a long time without moving. Get up and move around at least one time each hour.  Exercise and stretch regularly, as told by your doctor.  Do not lift anything that is heavier than 10 lb (4.5 kg) while you have symptoms of sciatica. ? Avoid lifting heavy things even when you do not have symptoms. ? Avoid lifting heavy things over and over.  When you lift objects, always lift in a way that is safe for your body. To do this, you should: ? Bend your knees. ? Keep the object close to your body. ? Avoid twisting. General instructions  Use good posture. ? Avoid leaning forward when you are sitting. ? Avoid hunching over when you are standing.  Stay at a healthy weight.  Wear comfortable shoes that support your feet. Avoid wearing high heels.  Avoid  sleeping on a mattress that is too soft or too hard. You might have less pain if you sleep on a mattress that is firm enough to support your back.  Keep all follow-up visits as told by your doctor. This is important. Contact a doctor if:  You have pain that: ? Wakes you up when you are sleeping. ? Gets worse when you lie down. ? Is worse than the pain you have had in the past. ?  Lasts longer than 4 weeks.  You lose weight for without trying. Get help right away if:  You cannot control when you pee (urinate) or poop (have a bowel movement).  You have weakness in any of these areas and it gets worse. ? Lower back. ? Lower belly (pelvis). ? Butt (buttocks). ? Legs.  You have redness or swelling of your back.  You have a burning feeling when you pee. This information is not intended to replace advice given to you by your health care provider. Make sure you discuss any questions you have with your health care provider. Document Released: 10/25/2007 Document Revised: 06/23/2015 Document Reviewed: 09/24/2014 Elsevier Interactive Patient Education  2018 [employer] Inc.      Agustina Caroli, MD [facility]

## 2016-07-07 NOTE — Patient Instructions (Addendum)
   IF you received an x-ray today, you will receive an invoice from [facility]. Please contact [facility] at [phone] with questions or concerns regarding your invoice.   IF you received labwork today, you will receive an invoice from [facility]. Please contact [facility] at [phone] with questions or concerns regarding your invoice.   Our billing staff will not be able to assist you with questions regarding bills from these companies.  You will be contacted with the lab results as soon as they are available. The fastest way to get your results is to activate your My Chart account. Instructions are located on the last page of this paperwork. If you have not heard from us regarding the results in 2 weeks, please contact this office.     Sciatica Sciatica is pain, numbness, weakness, or tingling along your sciatic nerve. The sciatic nerve starts in the lower back and goes down the back of each leg. Sciatica happens when this nerve is pinched or has pressure put on it. Sciatica usually goes away on its own or with treatment. Sometimes, sciatica may keep coming back (recur). Follow these instructions at home: Medicines  Take over-the-counter and prescription medicines only as told by your doctor.  Do not drive or use heavy machinery while taking prescription pain medicine. Managing pain  If directed, put ice on the affected area. ? Put ice in a plastic bag. ? Place a towel between your skin and the bag. ? Leave the ice on for 20 minutes, 2-3 times a day.  After icing, apply heat to the affected area before you exercise or as often as told by your doctor. Use the heat source that your doctor tells you to use, such as a moist heat pack or a heating pad. ? Place a towel between your skin and the heat source. ? Leave the heat on for 20-30 minutes. ? Remove the heat if your skin turns bright red. This is especially important if you are unable to feel pain, heat, or  cold. You may have a greater risk of getting burned. Activity  Return to your normal activities as told by your doctor. Ask your doctor what activities are safe for you. ? Avoid activities that make your sciatica worse.  Take short rests during the day. Rest in a lying or standing position. This is usually better than sitting to rest. ? When you rest for a long time, do some physical activity or stretching between periods of rest. ? Avoid sitting for a long time without moving. Get up and move around at least one time each hour.  Exercise and stretch regularly, as told by your doctor.  Do not lift anything that is heavier than 10 lb (4.5 kg) while you have symptoms of sciatica. ? Avoid lifting heavy things even when you do not have symptoms. ? Avoid lifting heavy things over and over.  When you lift objects, always lift in a way that is safe for your body. To do this, you should: ? Bend your knees. ? Keep the object close to your body. ? Avoid twisting. General instructions  Use good posture. ? Avoid leaning forward when you are sitting. ? Avoid hunching over when you are standing.  Stay at a healthy weight.  Wear comfortable shoes that support your feet. Avoid wearing high heels.  Avoid sleeping on a mattress that is too soft or too hard. You might have less pain if you sleep on a mattress that is firm enough   to support your back.  Keep all follow-up visits as told by your doctor. This is important. Contact a doctor if:  You have pain that: ? Wakes you up when you are sleeping. ? Gets worse when you lie down. ? Is worse than the pain you have had in the past. ? Lasts longer than 4 weeks.  You lose weight for without trying. Get help right away if:  You cannot control when you pee (urinate) or poop (have a bowel movement).  You have weakness in any of these areas and it gets worse. ? Lower back. ? Lower belly (pelvis). ? Butt (buttocks). ? Legs.  You have redness  or swelling of your back.  You have a burning feeling when you pee. This information is not intended to replace advice given to you by your health care provider. Make sure you discuss any questions you have with your health care provider. Document Released: 10/25/2007 Document Revised: 06/23/2015 Document Reviewed: 09/24/2014 Elsevier Interactive Patient Education  2018 [employer] Inc.  

## 2016-07-09 ENCOUNTER — Encounter: Payer: Self-pay | Admitting: Family Medicine

## 2016-07-09 ENCOUNTER — Ambulatory Visit (INDEPENDENT_AMBULATORY_CARE_PROVIDER_SITE_OTHER): Payer: 59 | Admitting: Family Medicine

## 2016-07-09 VITALS — BP 146/74 | HR 72 | Ht 69.0 in | Wt 321.0 lb

## 2016-07-09 DIAGNOSIS — M5441 Lumbago with sciatica, right side: Secondary | ICD-10-CM

## 2016-07-09 MED ORDER — DIAZEPAM 5 MG PO TABS: ORAL_TABLET | ORAL | 0 refills | Status: DC

## 2016-07-09 MED ORDER — OXYCODONE-ACETAMINOPHEN 7.5-325 MG PO TABS: 1.0000 | ORAL_TABLET | Freq: Four times a day (QID) | ORAL | 0 refills | Status: DC | PRN

## 2016-07-09 MED FILL — OXYCODON-ACETAMINOPHEN 7.5-: 7.5-325 | 5 days supply | Qty: 20 | Fill #0

## 2016-07-09 MED FILL — diazePAM 5 MG TABS: 5 | 1 days supply | Qty: 2 | Fill #0

## 2016-07-09 NOTE — Patient Instructions (Signed)
Continue the medrol, flexeril, gabapentin. Take oxycodone as needed for severe pain. Take valium prior to the MRI - someone will have to drive you to and from the MRI. I will call you with the results and next steps. This is consistent with an L4, possibly L5 radiculopathy (pinched nerve).

## 2016-07-09 NOTE — Progress Notes (Addendum)
PCP: Carlyle Dolly, MD  Subjective:   HPI: Patient is a 51 y.o. female here for low back pain.  Patient reports she had problems with her low back about a month ago. At that time was radiating into right lower extremity. She reports she did well with medrol dose pack, diclofenac. Was also given IM injection of depomedrol. Then she reports about 1-2 weeks ago she moved furniture and felt sharp pain in right low back radiating into right leg. Pain is now worst it has been. 10/10 and sharp. Radiation of numbness into toes. Taking medrol dose pack again, flexeril, norco. Also is taking ibuprofen, gabapentin, had toradol injection. No bowel/bladder dysfunction. No skin changes.  Past Medical History:  Diagnosis Date  . Arthritis   . Hypertension   . Impaired glucose tolerance   . Obesity     Current Outpatient Prescriptions on File Prior to Visit  Medication Sig Dispense Refill  . amLODipine (NORVASC) 5 MG tablet Take 1 tablet (5 mg total) by mouth daily. 90 tablet 1  . aspirin 81 MG tablet Take 81 mg by mouth daily.    . bimatoprost (LUMIGAN) 0.03 % ophthalmic drops      . cyclobenzaprine (FLEXERIL) 10 MG tablet Take 1 tablet (10 mg total) by mouth 2 (two) times daily as needed for muscle spasms. 20 tablet 0  . dorzolamide-timolol (COSOPT) 22.3-6.8 MG/ML ophthalmic solution Place 1 drop into both eyes 2 (two) times daily.    Marland Kitchen gabapentin (NEURONTIN) 100 MG capsule Take 1 capsule (100 mg total) by mouth 3 (three) times daily. 90 capsule 3  . ibuprofen (ADVIL,MOTRIN) 800 MG tablet Take 1 tablet (800 mg total) by mouth every 8 (eight) hours as needed for moderate pain. 90 tablet 0  . lisinopril-hydrochlorothiazide (PRINZIDE,ZESTORETIC) 20-25 MG tablet Take 1 tablet by mouth daily. 90 tablet 1  . methylPREDNISolone (MEDROL DOSEPAK) 4 MG TBPK tablet Sig as indicated 21 tablet 1  . metoprolol (TOPROL-XL) 200 MG 24 hr tablet Take 1 tablet (200 mg total) by mouth daily. 90 tablet 1   . Multiple Vitamin (MULTIVITAMIN) capsule       No current facility-administered medications on file prior to visit.     Past Surgical History:  Procedure Laterality Date  . COLONOSCOPY N/A 01/05/2016   Procedure: COLONOSCOPY;  Surgeon: Arta Silence, MD;  Location: [facility];  Service: Endoscopy;  Laterality: N/A;    No Known Allergies  Social History   Social History  . Marital status: Married    Spouse name: N/A  . Number of children: N/A  . Years of education: N/A   Occupational History  . Not on file.   Social History Main Topics  . Smoking status: Never Smoker  . Smokeless tobacco: Never Used  . Alcohol use Yes  . Drug use: No  . Sexual activity: Not on file   Other Topics Concern  . 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     Family History  Problem Relation Age of Onset  . Diabetes Father   . Stroke Father   . Aneurysm Father   . Stroke Maternal Grandmother   . Diabetes Paternal Grandmother   . Hypertension Mother     BP (!) 146/74   Pulse 72   Ht 5\' 9"  (1.753 m)   Wt (!) 321 lb (145.6 kg)   LMP 09/29/2010   BMI 47.40 kg/m   Review of Systems: See HPI above.  Objective:  Physical Exam:  Gen: Uncomfortable in exam room in a wheelchair.  Back: No gross deformity, scoliosis. TTP right paraspinal lumbar region, buttock.  No midline or bony TTP. Unable to evaluate motion fully due to pain. Strength LEs 5/5 all muscle groups grossly.   Trace MSRs in patellar and achilles tendons, equal bilaterally. Positive SLR on right, negative left. Sensation diminished right lower leg medially compared to left.   Assessment & Plan:  1. Low back pain with radiation into right leg - Independently reviewed radiographs - noted degenerative changes at L4-5 and L5-S1.  Her distribution would suggest L4 or L5 radiculopathy.  Unfortunately she's not responding to conservative treatment including toradol,  ibuprofen, medrol, gabapentin, norco, flexeril.  Will go ahead with MRI to further assess.  Percocet instead of norco.  Continue medrol, gabapentin, flexeril.  Consider ESI, neurosurgery referral depending on results.  Addendum:  MRI reviewed and discussed with patient.  She has an L4 radiculopathy on right side.  Clinically she reports doing much better since office visit.  She would like to go ahead with physical therapy at this point, home exercises.  Declined ESI, neurosurgery referral.  Will give a few more days before returning to work (return 6/21).  Plan to follow up after 4-6 weeks of PT.

## 2016-07-10 NOTE — Assessment & Plan Note (Signed)
Independently reviewed radiographs - noted degenerative changes at L4-5 and L5-S1.  Her distribution would suggest L4 or L5 radiculopathy.  Unfortunately she's not responding to conservative treatment including toradol, ibuprofen, medrol, gabapentin, norco, flexeril.  Will go ahead with MRI to further assess.  Percocet instead of norco.  Continue medrol, gabapentin, flexeril.  Consider ESI, neurosurgery referral depending on results.

## 2016-07-11 ENCOUNTER — Emergency Department (HOSPITAL_COMMUNITY)
Admission: EM | Admit: 2016-07-11 | Discharge: 2016-07-11 | Disposition: A | Discharge status: Home / Self Care | Payer: 59 | Attending: Emergency Medicine | Admitting: Emergency Medicine

## 2016-07-11 ENCOUNTER — Encounter (HOSPITAL_COMMUNITY): Payer: Self-pay | Admitting: *Deleted

## 2016-07-11 DIAGNOSIS — M5431 Sciatica, right side: Secondary | ICD-10-CM | POA: Diagnosis not present

## 2016-07-11 DIAGNOSIS — Z79899 Other long term (current) drug therapy: Secondary | ICD-10-CM | POA: Insufficient documentation

## 2016-07-11 DIAGNOSIS — Z7982 Long term (current) use of aspirin: Secondary | ICD-10-CM | POA: Insufficient documentation

## 2016-07-11 DIAGNOSIS — I10 Essential (primary) hypertension: Secondary | ICD-10-CM | POA: Insufficient documentation

## 2016-07-11 DIAGNOSIS — M545 Low back pain: Secondary | ICD-10-CM | POA: Diagnosis not present

## 2016-07-11 MED ORDER — NAPROXEN 500 MG PO TABS: 500.0000 mg | ORAL_TABLET | Freq: Two times a day (BID) | ORAL | 0 refills | Status: DC

## 2016-07-11 MED ORDER — HYDROMORPHONE HCL 1 MG/ML IJ SOLN
1.0000 mg | Freq: Once | INTRAMUSCULAR | Status: AC
  Administered 2016-07-11: 1 mg via INTRAVENOUS
  Filled 2016-07-11: qty 1

## 2016-07-11 MED FILL — NAPROXEN 500 MG TABLET: 500 | 10 days supply | Qty: 20 | Fill #0

## 2016-07-11 NOTE — ED Provider Notes (Signed)
[facility] DEPT Provider Note   CSN: [id number] Arrival date & time: 07/11/16  1433  By signing my name below, I, Collene Leyden, attest that this documentation has been prepared under the direction and in the presence of Waynetta Pean, PA-C.Marland Kitchen Electronically Signed: Collene Leyden, Scribe. 07/11/16. 3:26 PM.   History   Chief Complaint Chief Complaint  Patient presents with  . Back Pain   HPI Comments: Misty Maxwell is a 51 y.o. female with a history of sciatica, who presents to the Emergency Department complaining of worsening sciatica pain that began several days ago. Patient reports lifting heavy furniture recently, making her back pain worse. Patient is currently followed by a sports medicine physician Dr. Barbaraann Barthel. The plan is for the patient to have an MRI of her lumbar spine in four days, this was ordered by Dr. Barbaraann Barthel. On 6/11 the patient received 20 tablets of percocet 7.5 mg. Patient reports going to urgent care 4 days ago, she was given prednisone and pain medication. Patient reports associated radiated down the right leg. No relief with pain medication or muscle relaxants. Patient is ambulatory in the emergency department. Patient denies any IV drug use or history of cancer. Patient denies any loss of bowel/bladder control, urinary retention, urinary symptoms, falls, abdominal pain, fever, chills, numbness, or weakness.   The history is provided by the patient. No language interpreter was used.    Past Medical History:  Diagnosis Date  . Arthritis   . Hypertension   . Impaired glucose tolerance   . Obesity     Patient Active Problem List   Diagnosis Date Noted  . Health care maintenance 09/04/2013  . Prediabetes 05/02/2009  . METABOLIC SYNDROME X 40/98/1191  . KNEE PAIN, BILATERAL 08/11/2007  . LEG PAIN, RIGHT 08/11/2007  . PARESTHESIA 08/11/2007  . Low back pain radiating to right leg 08/04/2007  . OBESITY, MORBID 03/28/2006  . GLAUCOMA 03/28/2006  .  HYPERTENSION, BENIGN SYSTEMIC 03/28/2006  . OSTEOARTHRITIS, LOWER LEG 03/28/2006    Past Surgical History:  Procedure Laterality Date  . COLONOSCOPY N/A 01/05/2016   Procedure: COLONOSCOPY;  Surgeon: Arta Silence, MD;  Location: [facility];  Service: Endoscopy;  Laterality: N/A;    OB History    No data available       Home Medications    Prior to Admission medications   Medication Sig Start Date End Date Taking? Authorizing Provider  amLODipine (NORVASC) 5 MG tablet Take 1 tablet (5 mg total) by mouth daily. 06/07/16   Carlyle Dolly, MD  aspirin 81 MG tablet Take 81 mg by mouth daily.    [provider]  bimatoprost (LUMIGAN) 0.03 % ophthalmic drops      [provider]  cyclobenzaprine (FLEXERIL) 10 MG tablet Take 1 tablet (10 mg total) by mouth 2 (two) times daily as needed for muscle spasms. 07/01/16   Lysbeth Penner, FNP  diazepam (VALIUM) 5 MG tablet Take 1 tablet 30 minutes prior to procedure - may repeat x1 07/09/16   Hudnall, Sharyn Lull, MD  dorzolamide-timolol (COSOPT) 22.3-6.8 MG/ML ophthalmic solution Place 1 drop into both eyes 2 (two) times daily.    [provider]  gabapentin (NEURONTIN) 100 MG capsule Take 1 capsule (100 mg total) by mouth 3 (three) times daily. 07/05/16   Carlyle Dolly, MD  ibuprofen (ADVIL,MOTRIN) 800 MG tablet Take 1 tablet (800 mg total) by mouth every 8 (eight) hours as needed for moderate pain. 07/05/16   Carlyle Dolly, MD  lisinopril-hydrochlorothiazide (PRINZIDE,ZESTORETIC) 20-25 MG tablet Take 1 tablet by mouth daily. 06/07/16   Carlyle Dolly, MD  methylPREDNISolone (MEDROL DOSEPAK) 4 MG TBPK tablet Sig as indicated 07/07/16   Horald Pollen, MD  metoprolol (TOPROL-XL) 200 MG 24 hr tablet Take 1 tablet (200 mg total) by mouth daily. 06/07/16   Carlyle Dolly, MD  Multiple Vitamin (MULTIVITAMIN) capsule      [provider]  naproxen (NAPROSYN) 500 MG tablet Take 1 tablet (500  mg total) by mouth 2 (two) times daily with a meal. 07/11/16   Waynetta Pean, PA-C  oxyCODONE-acetaminophen (PERCOCET) 7.5-325 MG tablet Take 1 tablet by mouth every 6 (six) hours as needed for severe pain. 07/09/16   Dene Gentry, MD    Family History Family History  Problem Relation Age of Onset  . Diabetes Father   . Stroke Father   . Aneurysm Father   . Stroke Maternal Grandmother   . Diabetes Paternal Grandmother   . Hypertension Mother     Social History Social History  Substance Use Topics  . Smoking status: Never Smoker  . Smokeless tobacco: Never Used  . Alcohol use Yes     Allergies   Patient has no known allergies.   Review of Systems Review of Systems  Constitutional: Negative for chills and fever.  Genitourinary: Negative for difficulty urinating and dysuria.  Musculoskeletal: Positive for arthralgias (right leg) and back pain. Negative for gait problem and neck pain.  Skin: Negative for rash and wound.  Neurological: Negative for weakness and numbness.     Physical Exam Updated Vital Signs BP (!) 161/103 (BP Location: Right Arm)   Pulse (!) 107   Temp 97.8 F (36.6 C) (Oral)   Resp 14   LMP 09/29/2010   SpO2 98%   Physical Exam  Constitutional: She appears well-developed and well-nourished. No distress.  Nontoxic appearing. Obese   HENT:  Head: Normocephalic and atraumatic.  Eyes: Right eye exhibits no discharge. Left eye exhibits no discharge.  Cardiovascular: Normal rate, regular rhythm, normal heart sounds and intact distal pulses.   HR 96 on exam  Pulmonary/Chest: Effort normal and breath sounds normal. No respiratory distress.  Musculoskeletal: Normal range of motion. She exhibits tenderness. She exhibits no edema or deformity.  Bilateral DTRs are intact. No midline back tenderness to palpation. No back TTP. No back erythema, deformity, or ecchymosis. Good strength with the plantar and dorsiflexion bilaterally.   Neurological: She is  alert. She displays normal reflexes. No sensory deficit. Coordination normal.  Antalgic gait. Bilateral patellar DTRs are intact. Sensation is intact to bilateral lower extremities.   Skin: Skin is warm and dry. Capillary refill takes less than 2 seconds. No rash noted. She is not diaphoretic. No erythema. No pallor.  Psychiatric: She has a normal mood and affect. Her behavior is normal.  Nursing note and vitals reviewed.    ED Treatments / Results  DIAGNOSTIC STUDIES: Oxygen Saturation is 98% on RA, normal by my interpretation.    COORDINATION OF CARE: 3:22 PM Discussed treatment plan with pt at bedside and pt agreed to plan, which includes a dilaudid shot and naproxen.   Labs (all labs ordered are listed, but only abnormal results are displayed) Labs Reviewed - No data to display  EKG  EKG Interpretation None       Radiology No results found.  Procedures Procedures (including critical care time)  Medications Ordered in ED Medications  HYDROmorphone (DILAUDID) injection 1 mg (1 mg Intravenous  Given 07/11/16 1540)     Initial Impression / Assessment and Plan / ED Course  I have reviewed the triage vital signs and the nursing notes.  Pertinent labs & imaging results that were available during my care of the patient were reviewed by me and considered in my medical decision making (see chart for details).     This is a 51 y.o. female with a history of sciatica, who presents to the Emergency Department complaining of worsening sciatica pain that began several days ago. Patient reports lifting heavy furniture recently, making her back pain worse. Patient is currently followed by a sports medicine physician Dr. Barbaraann Barthel. The plan is for the patient to have an MRI of her lumbar spine in four days, this was ordered by Dr. Barbaraann Barthel. On 6/11 the patient received 20 tablets of percocet 7.5 mg. Patient reports going to urgent care 4 days ago, she was given prednisone and pain medication.  Patient reports associated radiated down the right leg. No relief with pain medication or muscle relaxants. Patient is ambulatory in the emergency department.  On exam the patient is afebrile nontoxic appearing. She does have tenderness over her right buttocks. She is good strength with plantar and dorsiflexion bilaterally. Sensation is intact in her bilateral lower extremities. She is neurovascularly intact. She has an antalgic gait. She does appear to be uncomfortable. She has no loss of bowel or bladder control. No conjunctival or cauda equina. This appears to be exacerbation of her sciatica. I see no need for emergent imaging at this time. She's had x-rays done very recently. She's had no recent falls or injury. We'll provide her with pain medicine in the emergency department and discharge her with naproxen. She can use this in combination with her Percocet and Flexeril at home. I advised the patient to follow-up with their primary care provider and sports medicine this week. I advised her to keep her MRI appointment in 4 days.  I advised the patient to return to the emergency department with new or worsening symptoms or new concerns. The patient verbalized understanding and agreement with plan.     Final Clinical Impressions(s) / ED Diagnoses   Final diagnoses:  Sciatica of right side    New Prescriptions New Prescriptions   NAPROXEN (NAPROSYN) 500 MG TABLET    Take 1 tablet (500 mg total) by mouth 2 (two) times daily with a meal.   I personally performed the services described in this documentation, which was scribed in my presence. The recorded information has been reviewed and is accurate.      Waynetta Pean, PA-C 07/11/16 1554    Davonna Belling, MD 07/11/16 [phone]

## 2016-07-11 NOTE — ED Triage Notes (Signed)
Pt reports hx of sciatica, did heavy lifting and onset of severe back pain. Pt has been to pcp and ucc for same this week. Has been given meds and steroids with no relief. Pt already has MRI scheduled.

## 2016-07-15 ENCOUNTER — Ambulatory Visit
Admission: RE | Admit: 2016-07-15 | Discharge: 2016-07-15 | Disposition: A | Discharge status: Home / Self Care | Payer: 59 | Source: Ambulatory Visit | Attending: Family Medicine | Admitting: Family Medicine

## 2016-07-15 DIAGNOSIS — M48061 Spinal stenosis, lumbar region without neurogenic claudication: Secondary | ICD-10-CM | POA: Diagnosis not present

## 2016-07-15 DIAGNOSIS — M5126 Other intervertebral disc displacement, lumbar region: Secondary | ICD-10-CM | POA: Diagnosis not present

## 2016-07-15 DIAGNOSIS — M5441 Lumbago with sciatica, right side: Secondary | ICD-10-CM

## 2016-07-17 NOTE — Addendum Note (Signed)
Addended by: Sherrie George F on: 07/17/2016 09:24 AM   Modules accepted: Orders

## 2016-07-18 ENCOUNTER — Encounter: Payer: Self-pay | Admitting: Physical Therapy

## 2016-07-18 ENCOUNTER — Ambulatory Visit: Payer: 59 | Attending: Family Medicine | Admitting: Physical Therapy

## 2016-07-18 DIAGNOSIS — R262 Difficulty in walking, not elsewhere classified: Secondary | ICD-10-CM | POA: Insufficient documentation

## 2016-07-18 DIAGNOSIS — M545 Low back pain: Secondary | ICD-10-CM | POA: Diagnosis not present

## 2016-07-18 DIAGNOSIS — G8929 Other chronic pain: Secondary | ICD-10-CM | POA: Insufficient documentation

## 2016-07-18 DIAGNOSIS — M79604 Pain in right leg: Secondary | ICD-10-CM | POA: Diagnosis not present

## 2016-07-18 DIAGNOSIS — M6281 Muscle weakness (generalized): Secondary | ICD-10-CM | POA: Insufficient documentation

## 2016-07-18 NOTE — Therapy (Signed)
[facility] [place], [place], [address] Phone: [phone]   Fax:  [phone]  Physical Therapy Evaluation  Patient Details  Name: Misty Maxwell MRN: [id number] Date of Birth: [birth year] Referring Provider: Dene Gentry, MD  Encounter Date: 07/18/2016      PT End of Session - 07/18/16 1422    Visit Number 1   Number of Visits 13   Date for PT Re-Evaluation 08/31/16   Authorization Type [facility]   PT Start Time 1419   PT Stop Time 1500   PT Time Calculation (min) 41 min   Activity Tolerance Patient tolerated treatment well   Behavior During Therapy [facility] for tasks assessed/performed      Past Medical History:  Diagnosis Date  . Arthritis   . Hypertension   . Impaired glucose tolerance   . Obesity     Past Surgical History:  Procedure Laterality Date  . COLONOSCOPY N/A 01/05/2016   Procedure: COLONOSCOPY;  Surgeon: Arta Silence, MD;  Location: [facility];  Service: Endoscopy;  Laterality: N/A;    There were no vitals filed for this visit.       Subjective Assessment - 07/18/16 1423    Subjective Back in 2009 believes she got sciatic irritaiton from lifting ice. Injection and steriods were ineffective. About 2 years ago husband had CVA and lifting caused irritaiton again in sciatic nerve, reduced with steriods. Has been taking tuemeric which helped decrease pain. One more round of injection and steroids when husband came home with brain injury with tumeric decreased pain. 2 weeks ago decided to move furniture because she was feeling good. Urgent care gave injection but could not walk by Mon, went back for another injection. Still hurt so went back to MD and urgent care and was prescribed medication. Was referred to Dr Barbaraann Barthel who prescribed her more medication and MRI. Went to ED last Wed and was given a stronger injection and medicaition. Next day was able to sit on bed and walk to bathroom. Anterior  R shin numbness with needles.    Currently in Pain? Yes   Pain Location Leg   Pain Orientation Right;Anterior;Lower   Pain Descriptors / Indicators Numbness  sharp needles   Aggravating Factors  rolling, sitting   Pain Relieving Factors laying supine            [facility] PT Assessment - 07/18/16 0001      Assessment   Medical Diagnosis L4 radiculopathy   Referring Provider Dene Gentry, MD   Hand Dominance Right   Next MD Visit not scheduled at this time   Prior Therapy not this year     Precautions   Precautions None     Restrictions   Weight Bearing Restrictions No     Balance Screen   Has the patient fallen in the past 6 months No     [place] residence   Additional Comments staris at home     Prior Function   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; Librarian, academic in [place]   Overall Cognitive Status Within Functional Limits for tasks assessed     Observation/Other Assessments   Focus on Therapeutic Outcomes (FOTO)  46% limitation     Sensation   Additional Comments R great toe numb, anterior shin numbness     Posture/Postural Control   Posture Comments decreased lumbar lordosis     ROM / Strength  AROM / PROM / Strength AROM;Strength     AROM   Overall AROM Comments lumbar ROM, hip ROM WFL     Strength   Overall Strength Comments grossly 3+/5 hip, knee, ankle     Palpation   Palpation comment increased needle sensation on R shin            Objective measurements completed on examination: See above findings.          [facility] Adult PT Treatment/Exercise - 07/18/16 0001      Therapeutic Activites    Therapeutic Activities ADL's   ADL's bed mobility     Exercises   Exercises Lumbar     Lumbar Exercises: Stretches   Single Knee to Chest Stretch Limitations 5x10s holds     Lumbar Exercises: Supine   AB Set Limitations transverse abdominis engagement in hooklying      Manual Therapy   Manual therapy comments educated-pt performed desensitization techniques                PT Education - 07/18/16 1530    Education provided Yes   Education Details anatomy of condition, POC, HEP, exercise form/rationale   Person(s) Educated Patient   Methods Explanation;Demonstration;Tactile cues;Verbal cues;Handout   Comprehension Verbalized understanding;Returned demonstration;Verbal cues required;Tactile cues required;Need further instruction          PT Short Term Goals - 07/18/16 1526      PT SHORT TERM GOAL #1   Title Pt will demo proper sit to stand and bed mobility throughout her day without back pain by 7/13   Baseline compensates with LLE at eval   Time 3   Period Weeks   Status New     PT SHORT TERM GOAL #2   Title Pt will verbalize ambulating without a limp at least 80% of the time   Baseline limping on R leg for fear of pain   Time 3   Period Weeks   Status New           PT Long Term Goals - 07/18/16 1524      PT LONG TERM GOAL #1   Title FOTO to 26% limitation to indicate significant improvement in functional ability by 8/3   Baseline 46% limitation at eval   Time 6   Period Weeks   Status New     PT LONG TERM GOAL #2   Title Pt will be able to perform all self care and care activities for husband without limitation by back pain    Baseline limited right now, not doing a lot of her usual activities due to fear of pain returning   Time 6   Period Weeks   Status New     PT LONG TERM GOAL #3   Title Pt will be able to squat to floor to lift objects such as grocery bags without limitaiton by back pain   Baseline poor squat form due to compensatory patterns   Time 6   Period Weeks   Status New     PT LONG TERM GOAL #4   Title Pt will be able to stand and walk for at least 1 hour without limitation by back pain in order to return to work   Baseline not at work at this time   Time 6   Period Weeks   Status New                 Plan - 07/18/16 1504    Clinical Impression  Statement Pt presents to PT with complaints of chronic on and off back pain associated with numbness and tingling on anterior R shin. (See MRI report). Pt verbalized decrease in symptoms with single knee to chest and was able to perform bed mobility without pain. Prescribed desensitization for anterior shin and had discussion regarding uncertainty about return of sensation, important to maintain motor control. Will determine need for traction after pt tries these exercises and allows injection to work a little longer. Pt will benefit from skilled PT in order to improve stability to lumbopelvic region to decrease pain and meet long term functional goals.    History and Personal Factors relevant to plan of care: history of recurrent back pain, overweight,    Clinical Presentation Evolving   Clinical Presentation due to: worsening sensation in distal leg   Clinical Decision Making Moderate   Rehab Potential Good   PT Frequency 2x / week   PT Duration 6 weeks   PT Treatment/Interventions ADLs/Self Care Home Management;Cryotherapy;Electrical Stimulation;Iontophoresis 4mg /ml Dexamethasone;Functional mobility training;Stair training;Gait training;Ultrasound;Traction;Moist Heat;Therapeutic activities;Therapeutic exercise;Balance training;Neuromuscular re-education;Patient/family education;Passive range of motion;Manual techniques;Dry needling;Taping   PT Next Visit Plan traction?, mild flexion bias for stenosis but aware of bulding compressing L4   PT Home Exercise Plan proper bed mobility, desensitization, single knee to chest, abdominal engagement;    Consulted and Agree with Plan of Care Patient      Patient will benefit from skilled therapeutic intervention in order to improve the following deficits and impairments:  Abnormal gait, Difficulty walking, Increased muscle spasms, Decreased activity tolerance, Pain, Improper body mechanics,  Decreased strength, Impaired sensation, Postural dysfunction  Visit Diagnosis: Chronic right-sided low back pain, with sciatica presence unspecified - Plan: PT plan of care cert/re-cert  Pain in right leg - Plan: PT plan of care cert/re-cert  Muscle weakness (generalized) - Plan: PT plan of care cert/re-cert  Difficulty in walking, not elsewhere classified - Plan: PT plan of care cert/re-cert     Problem List Patient Active Problem List   Diagnosis Date Noted  . Health care maintenance 09/04/2013  . Prediabetes 05/02/2009  . METABOLIC SYNDROME X 85/88/5027  . KNEE PAIN, BILATERAL 08/11/2007  . LEG PAIN, RIGHT 08/11/2007  . PARESTHESIA 08/11/2007  . Low back pain radiating to right leg 08/04/2007  . OBESITY, MORBID 03/28/2006  . GLAUCOMA 03/28/2006  . HYPERTENSION, BENIGN SYSTEMIC 03/28/2006  . OSTEOARTHRITIS, LOWER LEG 03/28/2006   Yashica Sterbenz C. Lynnel Zanetti PT, DPT 07/18/16 3:33 PM   [facility] [address] [place], [place], [address] Phone: [phone]   Fax:  [phone]  Name: Misty Maxwell MRN: [id number] Date of Birth: 1965-12-03

## 2016-07-20 ENCOUNTER — Ambulatory Visit: Payer: 59 | Admitting: Physical Therapy

## 2016-07-20 DIAGNOSIS — M6281 Muscle weakness (generalized): Secondary | ICD-10-CM

## 2016-07-20 DIAGNOSIS — M79604 Pain in right leg: Secondary | ICD-10-CM | POA: Diagnosis not present

## 2016-07-20 DIAGNOSIS — M545 Low back pain: Secondary | ICD-10-CM | POA: Diagnosis not present

## 2016-07-20 DIAGNOSIS — G8929 Other chronic pain: Secondary | ICD-10-CM | POA: Diagnosis not present

## 2016-07-20 DIAGNOSIS — R262 Difficulty in walking, not elsewhere classified: Secondary | ICD-10-CM | POA: Diagnosis not present

## 2016-07-20 NOTE — Therapy (Signed)
[place], [place], [address] Phone: [phone]   Fax:  [phone]  Physical Therapy Treatment  Patient Details  Name: Misty Maxwell MRN: [id number] Date of Birth: [birth year] Referring Provider: Dene Gentry, MD  Encounter Date: 07/20/2016      PT End of Session - 07/20/16 1011    Visit Number 2   Number of Visits 13   Date for PT Re-Evaluation 08/31/16   Authorization Type [facility]   PT Start Time 1000   PT Stop Time 1039   PT Time Calculation (min) 39 min      Past Medical History:  Diagnosis Date  . Arthritis   . Hypertension   . Impaired glucose tolerance   . Obesity     Past Surgical History:  Procedure Laterality Date  . COLONOSCOPY N/A 01/05/2016   Procedure: COLONOSCOPY;  Surgeon: Arta Silence, MD;  Location: [facility];  Service: Endoscopy;  Laterality: N/A;    There were no vitals filed for this visit.      Subjective Assessment - 07/20/16 1007    Subjective Pain in back had resolved since getting injection however still had the N/T symptoms.  Difficulty sleeping last night due to leg discomfort. Right sided lumbar pain returned after sitting in chair before her PT appointment today.    Currently in Pain? Yes   Pain Score 4    Pain Location Back   Pain Orientation Right   Aggravating Factors  sitting in lobby chair    Pain Relieving Factors lay supine,                          [facility] Adult PT Treatment/Exercise - 07/20/16 0001      Lumbar Exercises: Stretches   Single Knee to Chest Stretch 2 reps;20 seconds   Piriformis Stretch Limitations figure 4 push and pull 2 x 30 sec each      Lumbar Exercises: Supine   Ab Set 10 reps   AB Set Limitations transverse abdominis engagement in hooklying   Clam Limitations alternating unilateral clams with transverse abdominal engaged    Other Supine Lumbar Exercises supine with feet in chair for decompression  , then went through decompression series x 5 each      Manual Therapy   Manual Therapy Manual Traction   Manual Traction bilateral long axis distraction                PT Education - 07/20/16 1046    Education provided Yes   Education Details HEP   Person(s) Educated Patient   Methods Explanation;Handout   Comprehension Verbalized understanding          PT Short Term Goals - 07/18/16 1526      PT SHORT TERM GOAL #1   Title Pt will demo proper sit to stand and bed mobility throughout her day without back pain by 7/13   Baseline compensates with LLE at eval   Time 3   Period Weeks   Status New     PT SHORT TERM GOAL #2   Title Pt will verbalize ambulating without a limp at least 80% of the time   Baseline limping on R leg for fear of pain   Time 3   Period Weeks   Status New           PT Long Term Goals - 07/18/16 1524      PT LONG TERM  GOAL #1   Title FOTO to 26% limitation to indicate significant improvement in functional ability by 8/3   Baseline 46% limitation at eval   Time 6   Period Weeks   Status New     PT LONG TERM GOAL #2   Title Pt will be able to perform all self care and care activities for husband without limitation by back pain    Baseline limited right now, not doing a lot of her usual activities due to fear of pain returning   Time 6   Period Weeks   Status New     PT LONG TERM GOAL #3   Title Pt will be able to squat to floor to lift objects such as grocery bags without limitaiton by back pain   Baseline poor squat form due to compensatory patterns   Time 6   Period Weeks   Status New     PT LONG TERM GOAL #4   Title Pt will be able to stand and walk for at least 1 hour without limitation by back pain in order to return to work   Baseline not at work at this time   Time 6   Period Weeks   Status New               Plan - 07/20/16 1046    Clinical Impression Statement Pt enters with 4/10 right low back pain after  sitting in lobby chair. Her numbness in RLE is constant. We reviewed HEP and introduced decompression position with leg in chair. Pt feels relief of pain in this position. Instructed her in decompression series and piriformis stretching for HEP. Long axis distraction also helpful. At end of treatment pt reports no pain and no N/T.     PT Next Visit Plan traction?, mild flexion bias for stenosis but aware of bulding compressing L4   PT Home Exercise Plan proper bed mobility, desensitization, single knee to chest, abdominal engagement; decompression series and positioning, piriformis push and pull    Consulted and Agree with Plan of Care Patient      Patient will benefit from skilled therapeutic intervention in order to improve the following deficits and impairments:  Abnormal gait, Difficulty walking, Increased muscle spasms, Decreased activity tolerance, Pain, Improper body mechanics, Decreased strength, Impaired sensation, Postural dysfunction  Visit Diagnosis: Chronic right-sided low back pain, with sciatica presence unspecified  Pain in right leg  Muscle weakness (generalized)  Difficulty in walking, not elsewhere classified     Problem List Patient Active Problem List   Diagnosis Date Noted  . Health care maintenance 09/04/2013  . Prediabetes 05/02/2009  . METABOLIC SYNDROME X 96/75/9163  . KNEE PAIN, BILATERAL 08/11/2007  . LEG PAIN, RIGHT 08/11/2007  . PARESTHESIA 08/11/2007  . Low back pain radiating to right leg 08/04/2007  . OBESITY, MORBID 03/28/2006  . GLAUCOMA 03/28/2006  . HYPERTENSION, BENIGN SYSTEMIC 03/28/2006  . OSTEOARTHRITIS, LOWER LEG 03/28/2006    Dorene Ar, [place] 07/20/2016, 11:12 AM  [place], [place], [address] Phone: [phone]   Fax:  [phone]  Name: Misty Maxwell MRN: [id number] Date of Birth: Apr 15, 1965

## 2016-07-24 ENCOUNTER — Encounter: Payer: Self-pay | Admitting: Family Medicine

## 2016-07-25 ENCOUNTER — Encounter: Payer: Self-pay | Admitting: Family Medicine

## 2016-07-25 MED ORDER — NAPROXEN 500 MG PO TABS: 500.0000 mg | ORAL_TABLET | Freq: Two times a day (BID) | ORAL | 1 refills | Status: DC

## 2016-07-25 MED FILL — NAPROXEN 500 MG TABLET: 500 | 30 days supply | Qty: 60 | Fill #0

## 2016-07-25 NOTE — Addendum Note (Signed)
Addended by: Dene Gentry on: 07/25/2016 04:27 PM   Modules accepted: Orders

## 2016-07-30 ENCOUNTER — Ambulatory Visit: Payer: 59 | Attending: Family Medicine | Admitting: Physical Therapy

## 2016-07-30 DIAGNOSIS — M545 Low back pain: Secondary | ICD-10-CM | POA: Diagnosis not present

## 2016-07-30 DIAGNOSIS — M79604 Pain in right leg: Secondary | ICD-10-CM | POA: Diagnosis not present

## 2016-07-30 DIAGNOSIS — M6281 Muscle weakness (generalized): Secondary | ICD-10-CM | POA: Insufficient documentation

## 2016-07-30 DIAGNOSIS — R262 Difficulty in walking, not elsewhere classified: Secondary | ICD-10-CM | POA: Diagnosis not present

## 2016-07-30 DIAGNOSIS — G8929 Other chronic pain: Secondary | ICD-10-CM | POA: Insufficient documentation

## 2016-07-30 NOTE — Therapy (Addendum)
[place], [place], [address] Phone: [phone]   Fax:  [phone]  Physical Therapy Treatment/Discharge Summary  Patient Details  Name: DIANEY SUCHY MRN: [id number] Date of Birth: 1965-12-14 Referring Provider: Dene Gentry, MD  Encounter Date: 07/30/2016      PT End of Session - 07/30/16 1417    Visit Number 3   Number of Visits 13   Date for PT Re-Evaluation 08/31/16   Authorization Type [facility]   PT Start Time 1779   PT Stop Time 1456   PT Time Calculation (min) 39 min   Activity Tolerance Patient tolerated treatment well   Behavior During Therapy [facility] for tasks assessed/performed      Past Medical History:  Diagnosis Date  . Arthritis   . Hypertension   . Impaired glucose tolerance   . Obesity     Past Surgical History:  Procedure Laterality Date  . COLONOSCOPY N/A 01/05/2016   Procedure: COLONOSCOPY;  Surgeon: Arta Silence, MD;  Location: [facility];  Service: Endoscopy;  Laterality: N/A;    There were no vitals filed for this visit.      Subjective Assessment - 07/30/16 1417    Subjective Pt reports a tightness on dorsal foot that is fine when she walks or stands and when she does ankle pumps but as soon as she lays down her ankle tightens. No longer has numbness in her toes. Feels that it has moved to her inner thigh.    Currently in Pain? No/denies                         [facility] Adult PT Treatment/Exercise - 07/30/16 0001      Lumbar Exercises: Stretches   Prone Mid Back Stretch Limitations seated physioball roll out + press into ball with sit up     Lumbar Exercises: Aerobic   Stationary Bike nu step L5 5 min     Lumbar Exercises: Seated   Hip Flexion on Ball Limitations seated lat press + LE add on blue physioball     Lumbar Exercises: Supine   AB Set Limitations with legs over bolster   Bent Knee Raise Limitations transv abdominis + bent leg  raise legs over bolster   Other Supine Lumbar Exercises decompression with legs over bolster                PT Education - 07/30/16 1449    Education provided Yes   Education Details sleeping posture, flexion bias, how to adapt exercises at work for symptom management   Person(s) Educated Patient   Methods Explanation;Demonstration;Tactile cues;Verbal cues   Comprehension Verbalized understanding;Returned demonstration;Verbal cues required;Tactile cues required;Need further instruction          PT Short Term Goals - 07/18/16 1526      PT SHORT TERM GOAL #1   Title Pt will demo proper sit to stand and bed mobility throughout her day without back pain by 7/13   Baseline compensates with LLE at eval   Time 3   Period Weeks   Status New     PT SHORT TERM GOAL #2   Title Pt will verbalize ambulating without a limp at least 80% of the time   Baseline limping on R leg for fear of pain   Time 3   Period Weeks   Status New           PT Long Term Goals -  07/18/16 1524      PT LONG TERM GOAL #1   Title FOTO to 26% limitation to indicate significant improvement in functional ability by 8/3   Baseline 46% limitation at eval   Time 6   Period Weeks   Status New     PT LONG TERM GOAL #2   Title Pt will be able to perform all self care and care activities for husband without limitation by back pain    Baseline limited right now, not doing a lot of her usual activities due to fear of pain returning   Time 6   Period Weeks   Status New     PT LONG TERM GOAL #3   Title Pt will be able to squat to floor to lift objects such as grocery bags without limitaiton by back pain   Baseline poor squat form due to compensatory patterns   Time 6   Period Weeks   Status New     PT LONG TERM GOAL #4   Title Pt will be able to stand and walk for at least 1 hour without limitation by back pain in order to return to work   Baseline not at work at this time   Time 6   Period Weeks    Status New               Plan - 07/30/16 1455    Clinical Impression Statement Flexion bias completely resolved symptoms on multiple occasions during the session. Theme is to flex and relax followed by use of abdominal wall to sit up. Discussed a couple of scenarios and how to utilize that approach. Discussed sleeping posture for flexion as well.    PT Treatment/Interventions ADLs/Self Care Home Management;Cryotherapy;Electrical Stimulation;Iontophoresis 18m/ml Dexamethasone;Functional mobility training;Stair training;Gait training;Ultrasound;Traction;Moist Heat;Therapeutic activities;Therapeutic exercise;Balance training;Neuromuscular re-education;Patient/family education;Passive range of motion;Manual techniques;Dry needling;Taping   PT Next Visit Plan flexion bias   PT Home Exercise Plan proper bed mobility, desensitization, single knee to chest, abdominal engagement; decompression series and positioning, piriformis push and pull; seated flexion with abdominal wall contraction to sit up   Consulted and Agree with Plan of Care Patient      Patient will benefit from skilled therapeutic intervention in order to improve the following deficits and impairments:  Abnormal gait, Difficulty walking, Increased muscle spasms, Decreased activity tolerance, Pain, Improper body mechanics, Decreased strength, Impaired sensation, Postural dysfunction  Visit Diagnosis: Chronic right-sided low back pain, with sciatica presence unspecified  Pain in right leg  Muscle weakness (generalized)  Difficulty in walking, not elsewhere classified     Problem List Patient Active Problem List   Diagnosis Date Noted  . Health care maintenance 09/04/2013  . Prediabetes 05/02/2009  . METABOLIC SYNDROME X 026/41/5830 . KNEE PAIN, BILATERAL 08/11/2007  . LEG PAIN, RIGHT 08/11/2007  . PARESTHESIA 08/11/2007  . Low back pain radiating to right leg 08/04/2007  . OBESITY, MORBID 03/28/2006  . GLAUCOMA  03/28/2006  . HYPERTENSION, BENIGN SYSTEMIC 03/28/2006  . OSTEOARTHRITIS, LOWER LEG 03/28/2006   Pinki Rottman C. Louanne Calvillo PT, DPT 07/30/16 2:58 PM   [facility] [address]Phone: [phone]  Fax:  [phone] Name: JNECOLE MINASSIANMRN: [id number] of Birth: 402/17/1967  PHYSICAL THERAPY DISCHARGE SUMMARY  Visits from Start of Care: 3  Current functional level related to goals / functional outcomes: See above   Remaining deficits: See above   Education / Equipment: Anatomy of condition,  POC, HEP, exercise form/rationale  Plan: Patient agrees to discharge.  Patient goals were not met. Patient is being discharged due to not returning since the last visit.  ?????    Pt requested transfer to [facility] location. Messages left by [facility] scheduling were not returned by pt.  Dawnell Bryant C. Jenan Ellegood PT, DPT 09/10/16 11:09 AM

## 2016-08-09 ENCOUNTER — Telehealth: Payer: Self-pay | Admitting: Family Medicine

## 2016-08-09 NOTE — Telephone Encounter (Signed)
Pt took the muscle relaxer the other day, but it did not help. Pt is having a lot of tightness and pain in her ankle. Pt is using ice packs. Pt is going to start gabapentin today and see if that helps. Pt wanted PCP aware and if there was anything else PCP wanted pt to do. ep

## 2016-08-09 NOTE — Telephone Encounter (Signed)
Returned patient's call. No answer. Left voicemail.   If patient call's back. Please tell her to continue ice packs and elevating her leg when not walking.   Smitty Cords, MD [place], PGY-3

## 2016-08-13 ENCOUNTER — Telehealth: Payer: Self-pay | Admitting: Family Medicine

## 2016-08-13 NOTE — Telephone Encounter (Signed)
Unable to contact patient.

## 2016-08-13 NOTE — Telephone Encounter (Signed)
Patient calling requesting to speak to provider regarding her sciatic nerve and physical therapy. States it is urgent   Patient can be reached at her cell phone: [phone]

## 2016-08-14 NOTE — Telephone Encounter (Signed)
Patient called to add that she has not been to physical therapy in 2 weeks due to no availability.  She also states she received a shot in the ED last month for her back which helped, but since then she has been experiencing pain, numbness, and stiffness starting below her knee down to her ankle  Patient was scheduled an appt. on 7/20 but still requesting a call back

## 2016-08-17 ENCOUNTER — Encounter: Payer: Self-pay | Admitting: Family Medicine

## 2016-08-17 ENCOUNTER — Ambulatory Visit (INDEPENDENT_AMBULATORY_CARE_PROVIDER_SITE_OTHER): Payer: 59 | Admitting: Family Medicine

## 2016-08-17 VITALS — BP 163/78 | HR 68 | Ht 68.0 in | Wt 320.0 lb

## 2016-08-17 DIAGNOSIS — M545 Low back pain, unspecified: Secondary | ICD-10-CM

## 2016-08-17 DIAGNOSIS — M79604 Pain in right leg: Secondary | ICD-10-CM

## 2016-08-17 MED ORDER — PREDNISONE 10 MG PO TABS: ORAL_TABLET | ORAL | 0 refills | Status: DC

## 2016-08-17 MED ORDER — METHYLPREDNISOLONE ACETATE 40 MG/ML IJ SUSP
40.0000 mg | Freq: Once | INTRAMUSCULAR | Status: AC
  Administered 2016-08-17: 40 mg via INTRAMUSCULAR

## 2016-08-17 MED FILL — predniSONE 10 MG TABS: 10 | 12 days supply | Qty: 42 | Fill #0

## 2016-08-17 NOTE — Patient Instructions (Signed)
Take 12 day course of the prednisone until this is gone. You were given an IM injection of depomedrol also. We will refer you for an epidural steroid injection and they will call you - if you're improving cancel this injection. Continue with physical therapy. Ok to take oxycodone as needed for severe pain.

## 2016-08-17 NOTE — Telephone Encounter (Signed)
Patient seen in office today - see office note for details.

## 2016-08-20 ENCOUNTER — Other Ambulatory Visit: Payer: Self-pay | Admitting: Family Medicine

## 2016-08-20 DIAGNOSIS — M5416 Radiculopathy, lumbar region: Secondary | ICD-10-CM

## 2016-08-22 NOTE — Progress Notes (Signed)
PCP: Carlyle Dolly, MD  Subjective:   HPI: Patient is a 51 y.o. female here for low back pain.  6/11: Patient reports she had problems with her low back about a month ago. At that time was radiating into right lower extremity. She reports she did well with medrol dose pack, diclofenac. Was also given IM injection of depomedrol. Then she reports about 1-2 weeks ago she moved furniture and felt sharp pain in right low back radiating into right leg. Pain is now worst it has been. 10/10 and sharp. Radiation of numbness into toes. Taking medrol dose pack again, flexeril, norco. Also is taking ibuprofen, gabapentin, had toradol injection. No bowel/bladder dysfunction. No skin changes.  7/20: Patient reports she is struggling with pain in right side of low back down into right leg. Feels like nerves are running around in her right leg. Feels hot also. Pain level 10/10 and sharp down past knee. PT had been helping but not a lot of availability recently. Tried heating pad, naproxen, gabapentin, pillow between legs. No skin changes.  Past Medical History:  Diagnosis Date  . Arthritis   . Hypertension   . Impaired glucose tolerance   . Obesity     Current Outpatient Prescriptions on File Prior to Visit  Medication Sig Dispense Refill  . amLODipine (NORVASC) 5 MG tablet Take 1 tablet (5 mg total) by mouth daily. 90 tablet 1  . aspirin 81 MG tablet Take 81 mg by mouth daily.    . bimatoprost (LUMIGAN) 0.03 % ophthalmic drops      . cyclobenzaprine (FLEXERIL) 10 MG tablet Take 1 tablet (10 mg total) by mouth 2 (two) times daily as needed for muscle spasms. 20 tablet 0  . diazepam (VALIUM) 5 MG tablet Take 1 tablet 30 minutes prior to procedure - may repeat x1 2 tablet 0  . dorzolamide-timolol (COSOPT) 22.3-6.8 MG/ML ophthalmic solution Place 1 drop into both eyes 2 (two) times daily.    Marland Kitchen gabapentin (NEURONTIN) 100 MG capsule Take 1 capsule (100 mg total) by mouth 3 (three)  times daily. 90 capsule 3  . ibuprofen (ADVIL,MOTRIN) 800 MG tablet Take 1 tablet (800 mg total) by mouth every 8 (eight) hours as needed for moderate pain. 90 tablet 0  . lisinopril-hydrochlorothiazide (PRINZIDE,ZESTORETIC) 20-25 MG tablet Take 1 tablet by mouth daily. 90 tablet 1  . methylPREDNISolone (MEDROL DOSEPAK) 4 MG TBPK tablet Sig as indicated 21 tablet 1  . metoprolol (TOPROL-XL) 200 MG 24 hr tablet Take 1 tablet (200 mg total) by mouth daily. 90 tablet 1  . Multiple Vitamin (MULTIVITAMIN) capsule      . naproxen (NAPROSYN) 500 MG tablet Take 1 tablet (500 mg total) by mouth 2 (two) times daily with a meal. 60 tablet 1  . oxyCODONE-acetaminophen (PERCOCET) 7.5-325 MG tablet Take 1 tablet by mouth every 6 (six) hours as needed for severe pain. 20 tablet 0   No current facility-administered medications on file prior to visit.     Past Surgical History:  Procedure Laterality Date  . COLONOSCOPY N/A 01/05/2016   Procedure: COLONOSCOPY;  Surgeon: Arta Silence, MD;  Location: [facility];  Service: Endoscopy;  Laterality: N/A;    No Known Allergies  Social History   Social History  . Marital status: Married    Spouse name: N/A  . Number of children: N/A  . Years of education: N/A   Occupational History  . Not on file.   Social History Main Topics  . Smoking status: Never  Smoker  . Smokeless tobacco: Never Used  . Alcohol use Yes  . Drug use: No  . Sexual activity: Not on file   Other Topics Concern  . Not on file   Social History Narrative   Lives in [place]. Works at the hospital.     Family History  Problem Relation Age of Onset  . Diabetes Father   . Stroke Father   . Aneurysm Father   . Stroke Maternal Grandmother   . Diabetes Paternal Grandmother   . Hypertension Mother     BP (!) 163/78   Pulse 68   Ht 5\' 8"  (1.727 m)   Wt (!) 320 lb (145.2 kg)   LMP 09/29/2010   BMI 48.66 kg/m   Review of Systems: See HPI above.     Objective:   Physical Exam:  Gen: Uncomfortable in exam room in a wheelchair.  Back: No gross deformity, scoliosis. TTP right paraspinal lumbar region, buttock.  No midline or bony TTP. Unable to evaluate motion fully due to pain. Strength LEs 5/5 all muscle groups.   Trace MSRs in patellar and achilles tendons, equal bilaterally. Positive SLR on right, negative left. Sensation diminished right lower leg medially compared to left.   Assessment & Plan:  1. Low back pain with radiation into right leg - Radiographs with degenerative changes L4-5 and L5-S1.  MRI consistent with right L4 radiculopathy.  Discussed options - went ahead with IM depomedrol and oral 12 day course of prednisone.  Will tentatively set up for [facility] but can cancel if improving.  Continue with physical therapy and home exercises.  Oxycodone as needed for severe pain.

## 2016-08-22 NOTE — Assessment & Plan Note (Signed)
Radiographs with degenerative changes L4-5 and L5-S1.  MRI consistent with right L4 radiculopathy.  Discussed options - went ahead with IM depomedrol and oral 12 day course of prednisone.  Will tentatively set up for [facility] but can cancel if improving.  Continue with physical therapy and home exercises.  Oxycodone as needed for severe pain.

## 2016-08-28 ENCOUNTER — Other Ambulatory Visit: Payer: Self-pay | Admitting: Family Medicine

## 2016-08-28 ENCOUNTER — Ambulatory Visit
Admission: RE | Admit: 2016-08-28 | Discharge: 2016-08-28 | Disposition: A | Discharge status: Home / Self Care | Payer: 59 | Source: Ambulatory Visit | Attending: Family Medicine | Admitting: Family Medicine

## 2016-08-28 DIAGNOSIS — M5116 Intervertebral disc disorders with radiculopathy, lumbar region: Secondary | ICD-10-CM | POA: Diagnosis not present

## 2016-08-28 DIAGNOSIS — M5416 Radiculopathy, lumbar region: Secondary | ICD-10-CM

## 2016-08-28 MED ORDER — METHYLPREDNISOLONE ACETATE 40 MG/ML INJ SUSP (RADIOLOG
120.0000 mg | Freq: Once | INTRAMUSCULAR | Status: AC
  Administered 2016-08-28: 120 mg via EPIDURAL

## 2016-08-28 MED ORDER — IOPAMIDOL (ISOVUE-M 200) INJECTION 41%
1.0000 mL | Freq: Once | INTRAMUSCULAR | Status: AC
  Administered 2016-08-28: 1 mL via EPIDURAL

## 2016-08-28 MED FILL — GABAPENTIN 100 MG CAP: 100 | 30 days supply | Qty: 90 | Fill #1

## 2016-08-28 NOTE — Discharge Instructions (Signed)

## 2016-09-10 MED FILL — LISINOPRIL-HCTZ 20-25 MG TA: 20-25 | 90 days supply | Qty: 90 | Fill #1

## 2016-09-10 MED FILL — AMLODIPINE BESYLATE 5 MG TA: 5 | 90 days supply | Qty: 90 | Fill #1

## 2016-09-10 MED FILL — METOPROLOL SUCC ER 200 MG T: 200 | 90 days supply | Qty: 90 | Fill #1

## 2016-11-12 DIAGNOSIS — H401131 Primary open-angle glaucoma, bilateral, mild stage: Secondary | ICD-10-CM | POA: Diagnosis not present

## 2016-11-26 ENCOUNTER — Ambulatory Visit (INDEPENDENT_AMBULATORY_CARE_PROVIDER_SITE_OTHER): Payer: 59 | Admitting: Family Medicine

## 2016-11-26 ENCOUNTER — Encounter: Payer: Self-pay | Admitting: Family Medicine

## 2016-11-26 VITALS — BP 124/76 | HR 63 | Temp 98.4°F | Ht 68.0 in | Wt 343.8 lb

## 2016-11-26 DIAGNOSIS — R7303 Prediabetes: Secondary | ICD-10-CM

## 2016-11-26 DIAGNOSIS — I10 Essential (primary) hypertension: Secondary | ICD-10-CM

## 2016-11-26 DIAGNOSIS — M545 Low back pain, unspecified: Secondary | ICD-10-CM

## 2016-11-26 DIAGNOSIS — E118 Type 2 diabetes mellitus with unspecified complications: Secondary | ICD-10-CM | POA: Diagnosis not present

## 2016-11-26 DIAGNOSIS — Z114 Encounter for screening for human immunodeficiency virus [HIV]: Secondary | ICD-10-CM | POA: Diagnosis not present

## 2016-11-26 MED ORDER — DULOXETINE HCL 30 MG PO CPEP: 60.0000 mg | ORAL_CAPSULE | Freq: Every day | ORAL | 1 refills | Status: DC

## 2016-11-26 MED ORDER — LISINOPRIL-HYDROCHLOROTHIAZIDE 20-25 MG PO TABS: 1.0000 | ORAL_TABLET | Freq: Every day | ORAL | 1 refills | Status: DC

## 2016-11-26 MED ORDER — METOPROLOL SUCCINATE ER 200 MG PO TB24: 200.0000 mg | ORAL_TABLET | Freq: Every day | ORAL | 1 refills | Status: DC

## 2016-11-26 MED ORDER — GABAPENTIN 100 MG PO CAPS: 100.0000 mg | ORAL_CAPSULE | Freq: Every day | ORAL | 3 refills | Status: DC

## 2016-11-26 MED ORDER — AMLODIPINE BESYLATE 5 MG PO TABS: 5.0000 mg | ORAL_TABLET | Freq: Every day | ORAL | 1 refills | Status: DC

## 2016-11-26 MED ORDER — GABAPENTIN 100 MG PO CAPS: 300.0000 mg | ORAL_CAPSULE | Freq: Every day | ORAL | 3 refills | Status: DC

## 2016-11-26 MED ORDER — ASPIRIN 81 MG PO TABS: 81.0000 mg | ORAL_TABLET | Freq: Every day | ORAL | 1 refills | Status: DC

## 2016-11-26 MED FILL — GABAPENTIN 100 MG CAP: 100 | 30 days supply | Qty: 90 | Fill #0

## 2016-11-26 MED FILL — AMLODIPINE BESYLATE 5 MG TA: 5 | 90 days supply | Qty: 90 | Fill #0

## 2016-11-26 MED FILL — LISINOPRIL-HCTZ 20-25 MG TA: 20-25 | 90 days supply | Qty: 90 | Fill #0

## 2016-11-26 MED FILL — METOPROLOL SUCC ER 200 MG T: 200 | 90 days supply | Qty: 90 | Fill #0

## 2016-11-26 MED FILL — ASPIRIN ADULT LOW STRENGTH: 81 | 30 days supply | Qty: 30 | Fill #0

## 2016-11-26 MED FILL — DULoxetine HCL 30 MG CPEP: 30 | 30 days supply | Qty: 60 | Fill #0

## 2016-11-26 NOTE — Assessment & Plan Note (Addendum)
History of prediabetes. A1C today within diabetic range of 6.5. Discussed in detail with patient the importance of decreasing carbohydrates/sugar, exercise, and weight loss. Discussed starting Metformin but patient would prefer to try lifestyle modification first. She will follow up in 3 months for next A1C check. If A1C still within diabetic range patient agreeable to starting Metformin.

## 2016-11-26 NOTE — Patient Instructions (Signed)
Thank you for coming in today, it was so nice to see you! Today we talked about:    Right leg pain: I have prescribed you Duloxetine. Initial, 30 mg orally once daily for 1 week; increase to usual dosage of 60 mg once daily  You got your flu shot today, proud of you!  Your blood pressure is excellent  Please follow up in 1 month if you don't have any improvement in your leg pain. Otherwise I would like to see you in 6 months   Bring in all your medications or supplements to each appointment for review.   If we ordered any tests today, you will be notified via telephone of any abnormalities. If everything is normal you will get a letter in the mail.   If you have any questions or concerns, please do not hesitate to call the office at [phone]. You can also message me directly via MyChart.   Sincerely,  Smitty Cords, MD

## 2016-11-26 NOTE — Assessment & Plan Note (Signed)
Body mass index is 52.27 kg/m.  - Discussed importance of increasing physical activity, patient agreed to try and walk for at least 10 minutes 3 times a week - Discussed decreasing portion size of meals - Patient highly motivated motivated to lose weight

## 2016-11-26 NOTE — Assessment & Plan Note (Signed)
Controlled and at goal. -Continue amlodipine 5 mg daily -Continue lisinopril hydrochlorothiazide 20-25mg  daily -Continue Metoprolol 200 mg daily  -BMP today

## 2016-11-26 NOTE — Progress Notes (Signed)
Subjective:  Misty Maxwell is a 51 y.o. year old female past medical history of hypertension, obesity, osteoarthritis, lumbar back pain radiating to right leg who presents to office today for an annual physical examination.  Concerns today include:  1. Right leg pain: patient states that for the last 5 months she has had right lower leg pain that she describes as sharp and only occurs at night. She notes that she has a history of lumbar back pain which she sees Dr. Audelia Acton 4. She notes that in July she had epidural shot which helped her back pain. She notes that since her shot she has had anterior right leg numbness. She has she has tried NSAIDs, muscle relaxers, gabapentin, and oxycodone with no relief. She states that it is very hard for her to sleep at night due to this burning tingling pain in her right lower leg. States that she does not have leg pain with ambulation. Denies any leg weakness.  Review of Systems  Constitutional: Negative for fever and weight loss.  HENT: Negative for ear pain, hearing loss and sinus pain.   Eyes: Negative for blurred vision.  Respiratory: Negative for cough, shortness of breath and wheezing.   Cardiovascular: Negative for chest pain and leg swelling.  Gastrointestinal: Negative for abdominal pain, blood in stool, constipation, diarrhea, heartburn, melena, nausea and vomiting.  Genitourinary: Negative for dysuria and frequency.  Musculoskeletal: Positive for back pain and joint pain.  Skin: Negative for rash.  Neurological: Negative for dizziness, tingling, focal weakness and headaches.  Psychiatric/Behavioral: Negative for depression and suicidal ideas.    General Healthcare: Medication Compliance: yes Dx Hypertension: yes Dx Hyperlipidemia: no Diabetes: prediabetes Dx Obesity: yes Weight Loss: no Physical Activity: minimal, is on her feet a lot during the day at work. Difficulty with exercise due to back pain.  Urinary Incontinence:  none Menstrual hx: Postmenopausal  Social:  reports that she has never smoked. She has never used smokeless tobacco. Driving: Drives her own car, wears seatbelt  Alcohol Use: 1 drink a month  Tobacco: none   Other Drugs: none  Support and Life at Home: Lives with husband at home, is helping her husband because he had a stroke. Her brother in law in helping at home with her husband  Advanced Directives: No  Work: Works as a Librarian, academic in [employer]   Cancer:  Colorectal >> Colonoscopy: up to date Lung >> Tobacco Use: No              - If so, previous Low-Dose CT screen: No  Breast >> Mammogram: No  Cervical/Endometrial >>  - Postmenopausal: Yes - Hysterectomy: No  - Vaginal Bleeding: No  Skin >> Suspicious lesions: No   Other: Osteoporosis: none TDAP: up to date   Past Medical History Past Medical History:  Diagnosis Date  . Arthritis   . Hypertension   . Impaired glucose tolerance   . Obesity    Patient Active Problem List   Diagnosis Date Noted  . Health care maintenance 09/04/2013  . Diabetes ([place]) 05/02/2009  . KNEE PAIN, BILATERAL 08/11/2007  . PARESTHESIA 08/11/2007  . Low back pain radiating to right leg 08/04/2007  . OBESITY, MORBID 03/28/2006  . GLAUCOMA 03/28/2006  . HYPERTENSION, BENIGN SYSTEMIC 03/28/2006    Medications- reviewed and updated Current Outpatient Prescriptions  Medication Sig Dispense Refill  . amLODipine (NORVASC) 5 MG tablet Take 1 tablet (5 mg total) by mouth daily. 90 tablet 1  . aspirin 81 MG  tablet Take 1 tablet (81 mg total) by mouth daily. 30 tablet 1  . bimatoprost (LUMIGAN) 0.03 % ophthalmic drops      . dorzolamide-timolol (COSOPT) 22.3-6.8 MG/ML ophthalmic solution Place 1 drop into both eyes 2 (two) times daily.    Marland Kitchen lisinopril-hydrochlorothiazide (PRINZIDE,ZESTORETIC) 20-25 MG tablet Take 1 tablet by mouth daily. 90 tablet 1  . metoprolol (TOPROL-XL) 200 MG 24 hr tablet Take 1 tablet (200 mg total) by mouth daily.  90 tablet 1  . DULoxetine (CYMBALTA) 30 MG capsule Take 2 capsules (60 mg total) by mouth daily. 60 capsule 1  . gabapentin (NEURONTIN) 100 MG capsule Take 3 capsules (300 mg total) by mouth at bedtime. (Patient not taking: Reported on 11/26/2016) 90 capsule 3  . ibuprofen (ADVIL,MOTRIN) 800 MG tablet Take 1 tablet (800 mg total) by mouth every 8 (eight) hours as needed for moderate pain. (Patient not taking: Reported on 11/26/2016) 90 tablet 0  . Multiple Vitamin (MULTIVITAMIN) capsule       No current facility-administered medications for this visit.     Objective: BP 124/76 (BP Location: Right Arm, Patient Position: Sitting, Cuff Size: Large)   Pulse 63   Temp 98.4 F (36.9 C) (Oral)   Ht 5\' 8"  (1.727 m)   Wt (!) 343 lb 12.8 oz (155.9 kg)   LMP 09/29/2010   SpO2 97%   BMI 52.27 kg/m  Gen: In no acute distress, alert, cooperative with exam, well groomed, morbidly obese HEENT: NCAT, EOMI, PERRL CV: Regular rate and rhythm, normal S1/S2, no murmur, distant heart sounds  Resp: Clear to auscultation bilaterally, no wheezes, non-labored Abd: obese abdomen, soft, Non Tender, Non Distended, bowel sounds present, no guarding or organomegaly Ext: No edema, warm and well perfused Neuro: Alert and oriented, No gross deficits, normal gait Psych: Normal mood and affect Skin: hyperkeratotic skin on shins   Assessment/Plan:  HYPERTENSION, BENIGN SYSTEMIC Controlled and at goal. -Continue amlodipine 5 mg daily -Continue lisinopril hydrochlorothiazide 20-25mg  daily -Continue Metoprolol 200 mg daily  -BMP today  OBESITY, MORBID Body mass index is 52.27 kg/m.  - Discussed importance of increasing physical activity, patient agreed to try and walk for at least 10 minutes 3 times a week - Discussed decreasing portion size of meals - Patient highly motivated motivated to lose weight   Diabetes ([facility]) History of prediabetes. A1C today within diabetic range of 6.5. Discussed in detail with  patient the importance of decreasing carbohydrates/sugar, exercise, and weight loss. Discussed starting Metformin but patient would prefer to try lifestyle modification first. She will follow up in 3 months for next A1C check. If A1C still within diabetic range patient agreeable to starting Metformin.    Low back pain radiating to right leg Previous MRI consistent with right L4 radiculopathy. Previously given Gabapentin and Flexeril with no relief. Even received Oxycodone at one point, no relief.  - Discussed importance of weight loss with patient  - Begin Cymbalta. Initial, 30 mg orally once daily for 1 week; increase to usual dosage of 60 mg once daily - Follow up in 1 month    Annual wellness exam: Patient doing well overall besides above A/P. Received flu shot today. Up to date on mammogram and pap smear Orders Placed This Encounter  Procedures  . Comprehensive metabolic panel    Order Specific Question:   Has the patient fasted?    Answer:   No  . Hemoglobin A1c  . CBC with Differential  . HIV antibody    Margreta Journey  Juanito Doom, [place], PGY-3

## 2016-11-27 LAB — COMPREHENSIVE METABOLIC PANEL WITH GFR
ALT: 27 [IU]/L (ref 0–32)
AST: 20 [IU]/L (ref 0–40)
Albumin/Globulin Ratio: 1.3 (ref 1.2–2.2)
Albumin: 4 g/dL (ref 3.5–5.5)
Alkaline Phosphatase: 91 [IU]/L (ref 39–117)
BUN/Creatinine Ratio: 26 — ABNORMAL HIGH (ref 9–23)
BUN: 19 mg/dL (ref 6–24)
Bilirubin Total: 0.4 mg/dL (ref 0.0–1.2)
CO2: 26 mmol/L (ref 20–29)
Calcium: 9.9 mg/dL (ref 8.7–10.2)
Chloride: 100 mmol/L (ref 96–106)
Creatinine, Ser: 0.73 mg/dL (ref 0.57–1.00)
GFR calc Af Amer: 110 mL/min/{1.73_m2}
GFR calc non Af Amer: 96 mL/min/{1.73_m2}
Globulin, Total: 3.2 g/dL (ref 1.5–4.5)
Glucose: 117 mg/dL — ABNORMAL HIGH (ref 65–99)
Potassium: 4.2 mmol/L (ref 3.5–5.2)
Sodium: 141 mmol/L (ref 134–144)
Total Protein: 7.2 g/dL (ref 6.0–8.5)

## 2016-11-27 LAB — HEMOGLOBIN A1C
ESTIMATED AVERAGE GLUCOSE: 140 mg/dL
HEMOGLOBIN A1C: 6.5 % — AB (ref 4.8–5.6)

## 2016-11-27 LAB — CBC WITH DIFFERENTIAL/PLATELET
Basophils Absolute: 0 10*3/uL (ref 0.0–0.2)
Basos: 0 %
EOS (ABSOLUTE): 0.1 10*3/uL (ref 0.0–0.4)
Eos: 2 %
Hematocrit: 42.9 % (ref 34.0–46.6)
Hemoglobin: 14.2 g/dL (ref 11.1–15.9)
Immature Grans (Abs): 0 10*3/uL (ref 0.0–0.1)
Immature Granulocytes: 0 %
Lymphocytes Absolute: 1.8 10*3/uL (ref 0.7–3.1)
Lymphs: 27 %
MCH: 31.1 pg (ref 26.6–33.0)
MCHC: 33.1 g/dL (ref 31.5–35.7)
MCV: 94 fL (ref 79–97)
Monocytes Absolute: 0.8 10*3/uL (ref 0.1–0.9)
Monocytes: 12 %
Neutrophils Absolute: 3.9 10*3/uL (ref 1.4–7.0)
Neutrophils: 59 %
Platelets: 207 10*3/uL (ref 150–379)
RBC: 4.56 x10E6/uL (ref 3.77–5.28)
RDW: 13.6 % (ref 12.3–15.4)
WBC: 6.6 10*3/uL (ref 3.4–10.8)

## 2016-11-27 LAB — HIV ANTIBODY (ROUTINE TESTING W REFLEX): HIV SCREEN 4TH GENERATION: NONREACTIVE

## 2016-11-30 ENCOUNTER — Telehealth: Payer: Self-pay | Admitting: Family Medicine

## 2016-11-30 NOTE — Assessment & Plan Note (Addendum)
Previous MRI consistent with right L4 radiculopathy. Previously given Gabapentin and Flexeril with no relief. Even received Oxycodone at one point, no relief.  - Discussed importance of weight loss with patient  - Begin Cymbalta. Initial, 30 mg orally once daily for 1 week; increase to usual dosage of 60 mg once daily - Follow up in 1 month

## 2016-11-30 NOTE — Telephone Encounter (Signed)
Called patient to check in and see how her leg pain was feeling. She states she thinks it is better. Will continue to follow along with patient.

## 2017-04-03 ENCOUNTER — Encounter: Payer: Self-pay | Admitting: Family Medicine

## 2017-04-03 ENCOUNTER — Ambulatory Visit (INDEPENDENT_AMBULATORY_CARE_PROVIDER_SITE_OTHER): Payer: 59 | Admitting: Family Medicine

## 2017-04-03 ENCOUNTER — Other Ambulatory Visit: Payer: Self-pay

## 2017-04-03 VITALS — BP 118/78 | HR 88 | Temp 98.2°F | Ht 68.0 in | Wt 333.0 lb

## 2017-04-03 DIAGNOSIS — M79605 Pain in left leg: Secondary | ICD-10-CM | POA: Diagnosis not present

## 2017-04-03 DIAGNOSIS — E118 Type 2 diabetes mellitus with unspecified complications: Secondary | ICD-10-CM

## 2017-04-03 DIAGNOSIS — I10 Essential (primary) hypertension: Secondary | ICD-10-CM | POA: Diagnosis not present

## 2017-04-03 DIAGNOSIS — M79604 Pain in right leg: Secondary | ICD-10-CM | POA: Insufficient documentation

## 2017-04-03 LAB — POCT GLYCOSYLATED HEMOGLOBIN (HGB A1C): HEMOGLOBIN A1C: 6.1

## 2017-04-03 MED ORDER — LISINOPRIL-HYDROCHLOROTHIAZIDE 20-25 MG PO TABS: 1.0000 | ORAL_TABLET | Freq: Every day | ORAL | 1 refills | Status: DC

## 2017-04-03 MED ORDER — AMLODIPINE BESYLATE 5 MG PO TABS: 5.0000 mg | ORAL_TABLET | Freq: Every day | ORAL | 1 refills | Status: DC

## 2017-04-03 MED ORDER — METOPROLOL SUCCINATE ER 200 MG PO TB24: 200.0000 mg | ORAL_TABLET | Freq: Every day | ORAL | 1 refills | Status: DC

## 2017-04-03 MED FILL — METOPROLOL SUCC ER 200 MG T: 200 | 90 days supply | Qty: 90 | Fill #0

## 2017-04-03 MED FILL — LISINOPRIL-HCTZ 20-25 MG TA: 20-25 | 90 days supply | Qty: 90 | Fill #0

## 2017-04-03 MED FILL — AMLODIPINE BESYLATE 5 MG TA: 5 | 90 days supply | Qty: 90 | Fill #0

## 2017-04-03 NOTE — Progress Notes (Signed)
Subjective:    Patient ID: Misty Maxwell , female   DOB: April 02, 1965 , 52 y.o..   MRN: [id number]  HPI  [name] is here for  Chief Complaint  Patient presents with  . Diabetes    1. Chronic Diabetes  Disease Monitoring  Blood Sugar Ranges: Does not check  Polyuria: no   Visual problems: no  Medication Compliance: not any any medication  Medication Side Effects  Hypoglycemia: no  Patient is that she has made a lot of modifications in the way that she eats.  She continues to eat 3 meals a day.  She notes that she is eating much more vegetables and has completely eliminated all sugary beverages.  She is only drinking water for the last 3 months.  She notes that she has also been walking more and trying to park farther at work so she has more opportunities to exercise.  She has also tried to see a keto diet and is decreasing her carbs.    2. Leg pain: Patient endorses continued leg pain.  She was noted to have leg pain stemming from lower back pain.  Has tried multiple medications, is not taking the gabapentin and she says it did not help.  Initially she was taking Cymbalta and this did help a lot but gradually the effectiveness wore off.  She endorses pain in both of her legs and describes it as "sharp".  She notes that the pain is worse when she is laying down or sitting down for prolonged period of time.  Does not typically have leg pain with ambulation.  Denies any leg weakness.  3. Hypertension Blood pressure at home: Does not check regularly Exercise: As above Low salt diet: Compliant Medications: Compliant with all of her medications Side effects: None ROS: Denies headache, dizziness, visual changes, nausea, vomiting, chest pain, abdominal pain or shortness of breath. BP Readings from Last 3 Encounters:  04/03/17 118/78  11/26/16 124/76  08/28/16 (!) 173/94    Review of Systems: Per HPI.   Past Medical History: Patient Active Problem List   Diagnosis Date  Noted  . Bilateral leg pain 04/03/2017  . Health care maintenance 09/04/2013  . Prediabetes 05/02/2009  . KNEE PAIN, BILATERAL 08/11/2007  . PARESTHESIA 08/11/2007  . Low back pain radiating to right leg 08/04/2007  . OBESITY, MORBID 03/28/2006  . GLAUCOMA 03/28/2006  . HYPERTENSION, BENIGN SYSTEMIC 03/28/2006    Medications: reviewed   Social Hx:  reports that  has never smoked. she has never used smokeless tobacco.   Objective:   BP 118/78   Pulse 88   Temp 98.2 F (36.8 C) (Oral)   Ht 5\' 8"  (1.727 m)   Wt (!) 333 lb (151 kg)   LMP 09/29/2010   SpO2 98%   BMI 50.63 kg/m  Physical Exam  Gen: NAD, alert, cooperative with exam, well-appearing Cardiac: Regular rate and rhythm Faint DP pulses bilaterally Respiratory:  non-labored breathing Psych: good insight, normal mood and affect  Assessment & Plan:  Prediabetes A1c decreased from 6.5 to 6.1 today.  Patient is very happy.  Congratulated her on lowering her A1c through diet modification.  Discussed that she is still within prediabetic range.  She would prefer to continue optimizing nutrition and exercise to lower her A1c. -Patient will call Dr. Jenne Campus to schedule an appointment to optimize her nutrition with the diagnosis of prediabetes -Encouraged patient to continue to stay active and walk at least 3 times a week for  20 minutes at a time -Follow-up in 3 months for next A1c check  Bilateral leg pain Bilateral leg pain.  Was previously told that she had leg pain secondary to lumbar radiculopathy.  She denies any current back pain but continues to endorse leg pain mostly in her calves.  Based on her symptoms it is less likely claudication pain and more likely neuropathic pain, however DP pulses were faint on exam today. -Patient will see Dr. Valentina Lucks for ABIs of both of her legs -If ABIs are normal can consider restarting Cymbalta   HYPERTENSION, BENIGN SYSTEMIC Controlled medical -Continue amlodipine 5 mg daily,  lisinopril hydrochlorothiazide 20-25 mg daily - Continue metoprolol 200 mg daily, can consider decreasing this if this patient continues to lose weight and blood pressure remains under control -Follow-up in 3 months  Orders Placed This Encounter  Procedures  . POCT glycosylated hemoglobin (Hb A1C)   Meds ordered this encounter  Medications  . amLODipine (NORVASC) 5 MG tablet    Sig: Take 1 tablet (5 mg total) by mouth daily.    Dispense:  90 tablet    Refill:  1  . metoprolol (TOPROL-XL) 200 MG 24 hr tablet    Sig: Take 1 tablet (200 mg total) by mouth daily.    Dispense:  90 tablet    Refill:  1  . lisinopril-hydrochlorothiazide (PRINZIDE,ZESTORETIC) 20-25 MG tablet    Sig: Take 1 tablet by mouth daily.    Dispense:  90 tablet    Refill:  1    Smitty Cords, MD [place], PGY-3

## 2017-04-03 NOTE — Patient Instructions (Signed)
Thank you for coming in today, it was so nice to see you! Today we talked about:    Diabetes; congratulations!  You have lowered her A1c to 6.1 and you are now just prediabetes.  You need to continue to try and improve your nutrition and exercise to avoid medications.  I think that you would greatly benefit from seeing a nutritionist.  Please call Dr Jenne Campus at [phone] to schedule an appointment.  Continue all your other medications as prescribed  I would like to order a test on your legs to make sure you have good blood flow, you can schedule this at the front desk. It is called an "ABI study" and this will be with Dr. Valentina Lucks here  Please follow up in 3 months for your next A1C check.   If you have any questions or concerns, please do not hesitate to call the office at [phone]. You can also message me directly via MyChart.   Sincerely,  Smitty Cords, MD

## 2017-04-03 NOTE — Assessment & Plan Note (Signed)
Bilateral leg pain.  Was previously told that she had leg pain secondary to lumbar radiculopathy.  She denies any current back pain but continues to endorse leg pain mostly in her calves.  Based on her symptoms it is less likely claudication pain and more likely neuropathic pain, however DP pulses were faint on exam today. -Patient will see Dr. Valentina Lucks for ABIs of both of her legs -If ABIs are normal can consider restarting Cymbalta

## 2017-04-03 NOTE — Assessment & Plan Note (Signed)
A1c decreased from 6.5 to 6.1 today.  Patient is very happy.  Congratulated her on lowering her A1c through diet modification.  Discussed that she is still within prediabetic range.  She would prefer to continue optimizing nutrition and exercise to lower her A1c. -Patient will call Dr. Jenne Campus to schedule an appointment to optimize her nutrition with the diagnosis of prediabetes -Encouraged patient to continue to stay active and walk at least 3 times a week for 20 minutes at a time -Follow-up in 3 months for next A1c check

## 2017-04-03 NOTE — Assessment & Plan Note (Signed)
Controlled medical -Continue amlodipine 5 mg daily, lisinopril hydrochlorothiazide 20-25 mg daily - Continue metoprolol 200 mg daily, can consider decreasing this if this patient continues to lose weight and blood pressure remains under control -Follow-up in 3 months

## 2017-04-15 ENCOUNTER — Telehealth: Payer: Self-pay

## 2017-04-15 ENCOUNTER — Ambulatory Visit (INDEPENDENT_AMBULATORY_CARE_PROVIDER_SITE_OTHER): Payer: 59 | Admitting: Pharmacist

## 2017-04-15 ENCOUNTER — Encounter: Payer: Self-pay | Admitting: Pharmacist

## 2017-04-15 DIAGNOSIS — M79604 Pain in right leg: Secondary | ICD-10-CM | POA: Diagnosis not present

## 2017-04-15 DIAGNOSIS — M79605 Pain in left leg: Secondary | ICD-10-CM

## 2017-04-15 MED ORDER — GABAPENTIN 300 MG PO CAPS: 300.0000 mg | ORAL_CAPSULE | Freq: Two times a day (BID) | ORAL | 1 refills | Status: DC

## 2017-04-15 MED FILL — GABAPENTIN 300 MG CAPSULE: 300 | 30 days supply | Qty: 60 | Fill #0

## 2017-04-15 NOTE — Assessment & Plan Note (Signed)
Normal ABI and low likelihood of PAD based on ABI of 1.01 in a patient with symptoms of non-PAD related foot pain.   S/sx reported by this patient a burning/numb pain sensation that radiates upwards into the upper leg and is worse at rest.  Therefore, the pain is likely neurological. Past trial doses of gabapentin were not therapeutic (300 mg/day) and could be re-started and titrated up to a max of 3600 mg/day. Can consider pregabalin (Lyrica) if pain returns and pt follows up with Dr. Juanito Doom.

## 2017-04-15 NOTE — Telephone Encounter (Signed)
Pt had appointment with Dr. Valentina Lucks today- she is calling for Rx of gabapentin- states Dr. Valentina Lucks suggests possibly increasing her dose. [facility] Pt call back [phone] Wallace Cullens, RN

## 2017-04-15 NOTE — Progress Notes (Signed)
Patient ID: Misty Maxwell, female   DOB: 05-19-1965, 52 y.o.   MRN: [id number] Reviewed: Agree with Dr. Graylin Shiver documentation and management.

## 2017-04-15 NOTE — Patient Instructions (Addendum)
Thanks for coming into day Misty Maxwell! The blood flow to your legs is normal, which is great! Please talk to Dr. Juanito Doom if your foot pain returns. Great job on your weight loss as well! Contact Dr. Jenne Campus (nutritionist) if you have questions about your diet.   We would like you to re-start taking your baby aspirin EC (81 mg) because it can help prevent heart disease. Please follow up with Dr. Juanito Doom whenever you are available (1 - 2 weeks).

## 2017-04-15 NOTE — Progress Notes (Signed)
   Subjective:    Patient ID: Misty Maxwell, female    DOB: 08/22/1965, 52 y.o.   MRN: [id number]  HPI    Review of Systems     Objective:   Physical Exam        Assessment & Plan:

## 2017-04-15 NOTE — Progress Notes (Signed)
    S:    Patient arrives in good spirits and ambulating without assistance.    She presents to the clinic for PADABI evaluation.  Patient was referred on 04/03/2017.  Patient was last seen by Primary Care Provider on 04/03/2017.   Denies pain with walking.  Pain is described as numbness followed by a hot burning sensation which only occurs at rest and is relieved by activity.  Reports pain while at rest or standing still. Denies pain worsens when walking up hill or in a hurry. Denies pain when walking at an ordinary pace on a level surface.  Reports pain worsens after sitting or resting.  Pain is localized to right foot but sometimes numbness radiates up to her right upper leg.  S- Foot pain. C- Hot burning sensation. Also reports numbness in the right toe radiating to upper leg.  H- Recurrent from last year. Multiple episodes. O- Started last year. Pain has been recurrent for years but comes and goes in episodes. L-  From knee down to ankle only in the right leg.  A- When I sit down or lie down especially around night. Exacerbated by physical activity/overexertion.  R- Physical therapy, turmeric capsules, walking around/ambulating around.  O:  Lower extremity Physical Exam includes warm to touch with trace bilateral pulses. Slight hemostasis dermatitis and edema.   ABI overall = 1.01. Right Arm 142 mmHg    Left Arm 140 mmHg Right ankle posterior tibial 144 mmHg     dorsalis pedis 98 mmHg Left ankle posterior tibial 144 mmHg    dorsalis pedis 136 mmHg    A/P: Normal ABI and low likelihood of PAD based on ABI of 1.01 in a patient with symptoms of non-PAD related foot pain.   S/sx reported by this patient a burning/numb pain sensation that radiates upwards into the upper leg and is worse at rest.  Therefore, the pain is likely neurological. Past trial doses of gabapentin were not therapeutic (300 mg/day) and could be re-started and titrated up to a max of 3600 mg/day. Can consider pregabalin  (Lyrica) if pain returns and pt follows up with Dr. Juanito Doom.  Weight reduction - discussed follow-up plan with Dr. Jenne Campus.  Encouraged to make appointment in the near future.   Restarted aspirin EC 81 mg tablet once daily for ASCVD risk reduction.  Results reviewed and written information provided.   F/U Clinic Visit with Dr. Juanito Doom if pain returns.  Total time in face-to-face counseling 20 minutes.  Patient seen with Hildred Alamin, PharmD Candidate. Marland Kitchen

## 2017-04-15 NOTE — Telephone Encounter (Signed)
Refill sent.  Smitty Cords, MD [place], PGY-3

## 2017-04-16 DIAGNOSIS — Z0100 Encounter for examination of eyes and vision without abnormal findings: Secondary | ICD-10-CM | POA: Diagnosis not present

## 2017-05-09 MED FILL — DORZOLAMIDE-TIMOLOL EYE DRP: 22.3-6.8 | 50 days supply | Qty: 10 | Fill #1

## 2017-05-09 MED FILL — DULoxetine HCL 30 MG CPEP: 30 | 30 days supply | Qty: 60 | Fill #1

## 2017-05-15 MED FILL — GABAPENTIN 300 MG CAPSULE: 300 | 30 days supply | Qty: 60 | Fill #1

## 2017-06-19 ENCOUNTER — Other Ambulatory Visit: Payer: Self-pay | Admitting: Family Medicine

## 2017-06-19 DIAGNOSIS — Z1231 Encounter for screening mammogram for malignant neoplasm of breast: Secondary | ICD-10-CM

## 2017-06-26 ENCOUNTER — Other Ambulatory Visit: Payer: Self-pay | Admitting: Family Medicine

## 2017-06-26 MED FILL — GABAPENTIN 300 MG CAPSULE: 300 | 30 days supply | Qty: 60 | Fill #0

## 2017-07-10 ENCOUNTER — Ambulatory Visit
Admission: RE | Admit: 2017-07-10 | Discharge: 2017-07-10 | Disposition: A | Discharge status: Home / Self Care | Payer: 59 | Source: Ambulatory Visit | Attending: Family Medicine | Admitting: Family Medicine

## 2017-07-10 DIAGNOSIS — Z1231 Encounter for screening mammogram for malignant neoplasm of breast: Secondary | ICD-10-CM | POA: Diagnosis not present

## 2017-07-11 ENCOUNTER — Ambulatory Visit: Payer: 59

## 2017-07-14 IMAGING — MR MR LUMBAR SPINE W/O CM
4 of 5 series · 18 of 48 positions shown · non-contrast
Comparison: Lumbar radiographs 07/07/2016

CLINICAL DATA: Right-sided low back pain with sciatica

EXAM:
MRI LUMBAR SPINE WITHOUT CONTRAST
TECHNIQUE: Multiplanar, multisequence MR imaging of the lumbar spine was
performed. No intravenous contrast was administered.

[Series 6: T2 · sagittal · 4.0mm · 0.73mm/px · 6 of 15 slices shown (1 of 2)]
[im 1/15]
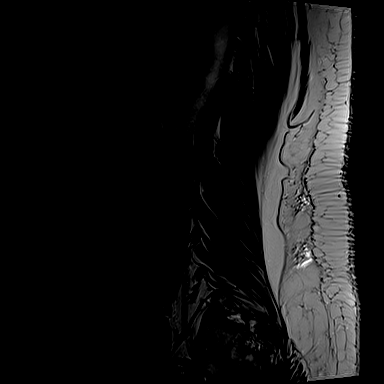
[im 3/15]
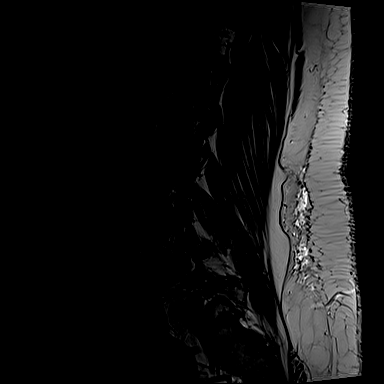
[im 6/15]
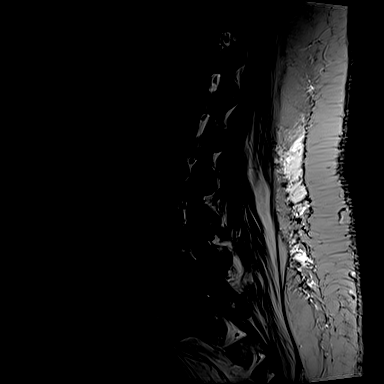
[im 9/15]
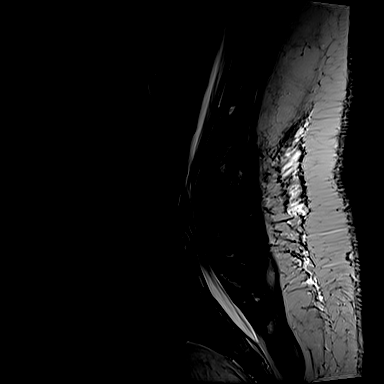
[im 12/15]
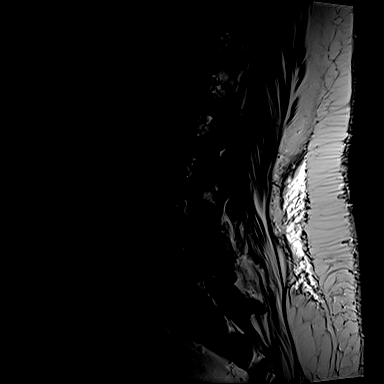
[im 15/15]
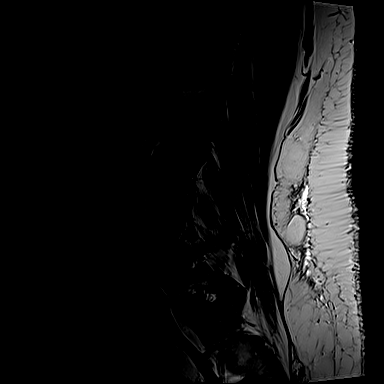

[Series 7: T1 · sagittal · 4.0mm · 0.73mm/px · 3 of 15 slices shown (1 of 2)]
[im 3/15]
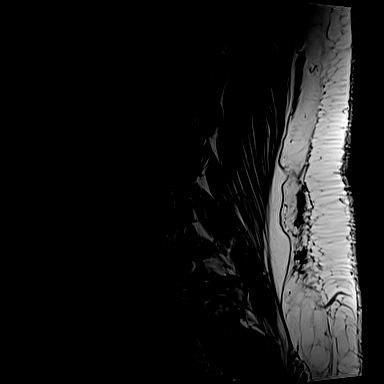
[im 9/15]
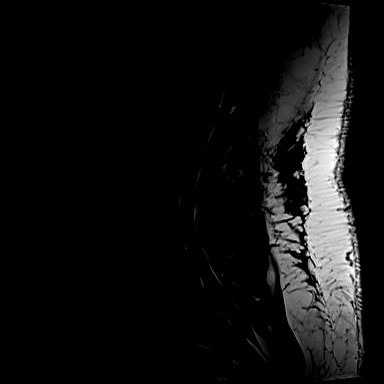
[im 15/15]
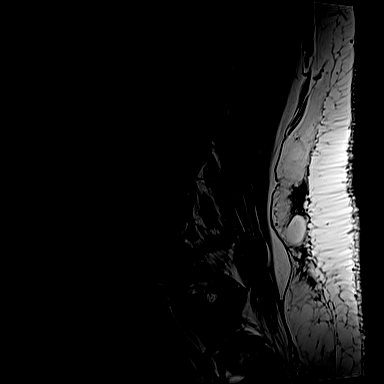

[Series 11: T1 · axial · 4.0mm · 0.28mm/px · z∈[-27,+139]mm · 3 of 39 slices shown (2 of 2)]
[im 6/39]
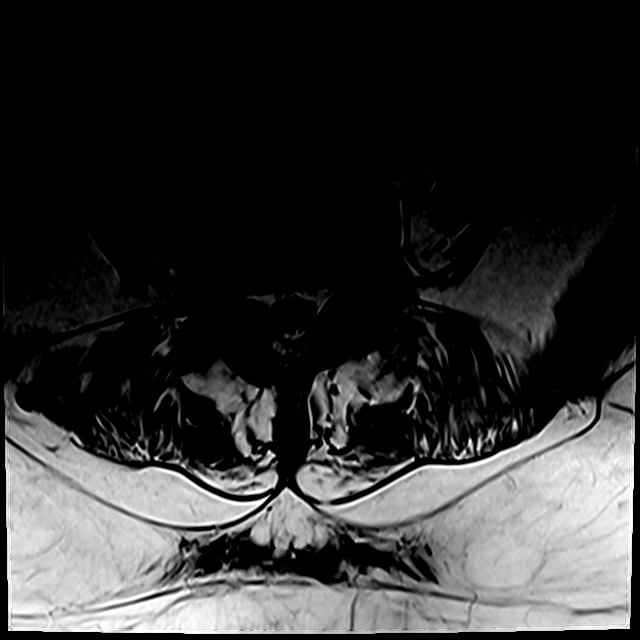
[im 20/39]
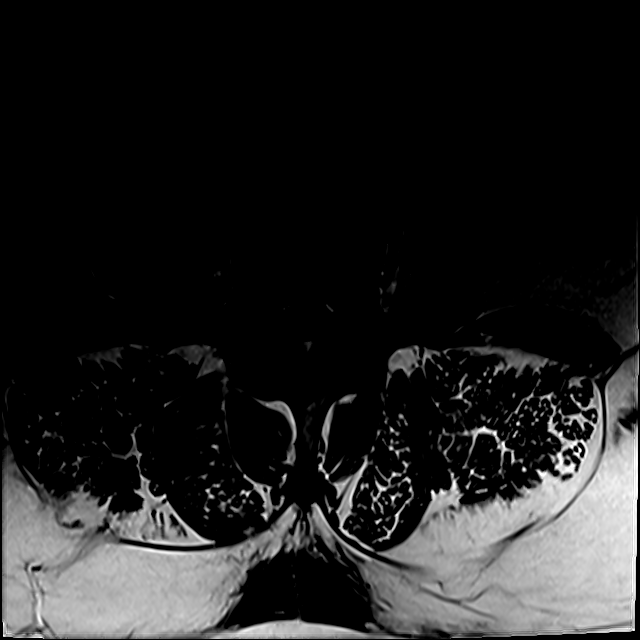
[im 33/39]
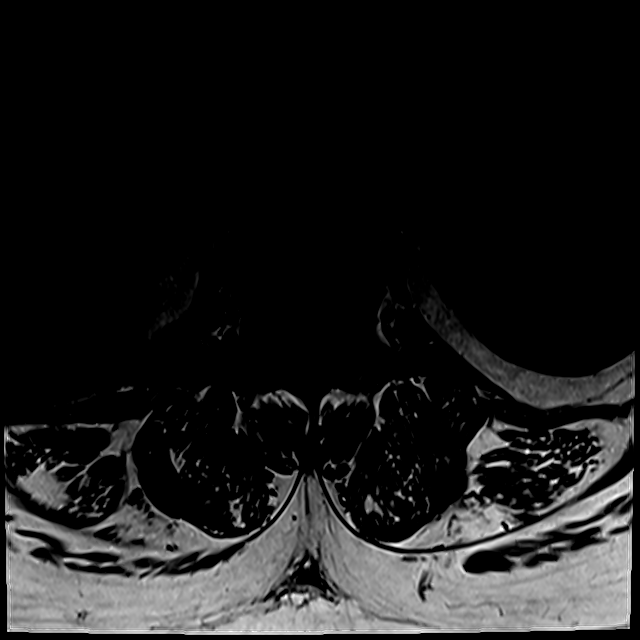

[Series 14: T2 · axial · 4.0mm · 0.28mm/px · z∈[-51,+139]mm · 6 of 39 slices shown (2 of 2)]
[im 1/39]
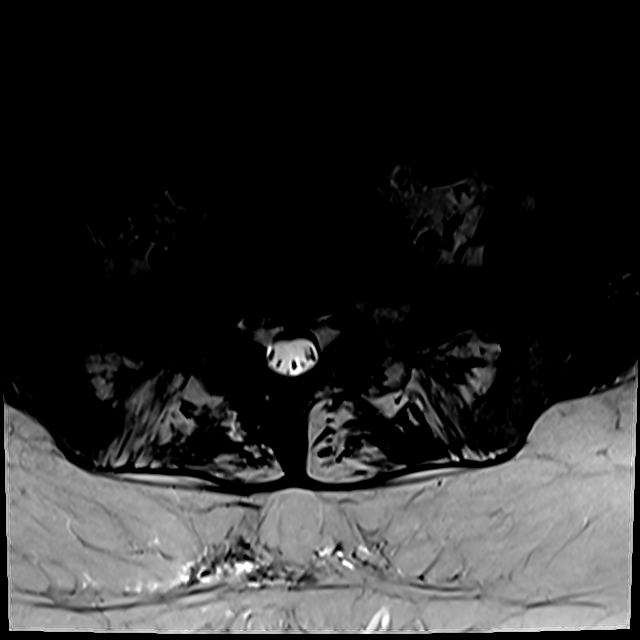
[im 6/39]
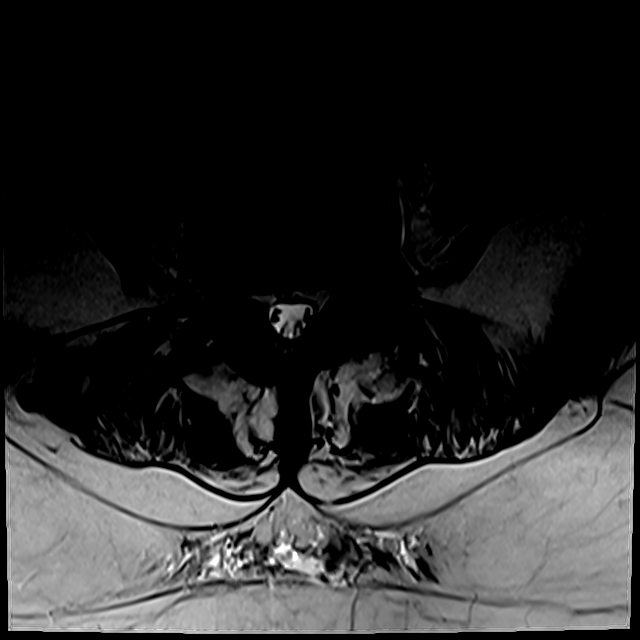
[im 11/39]
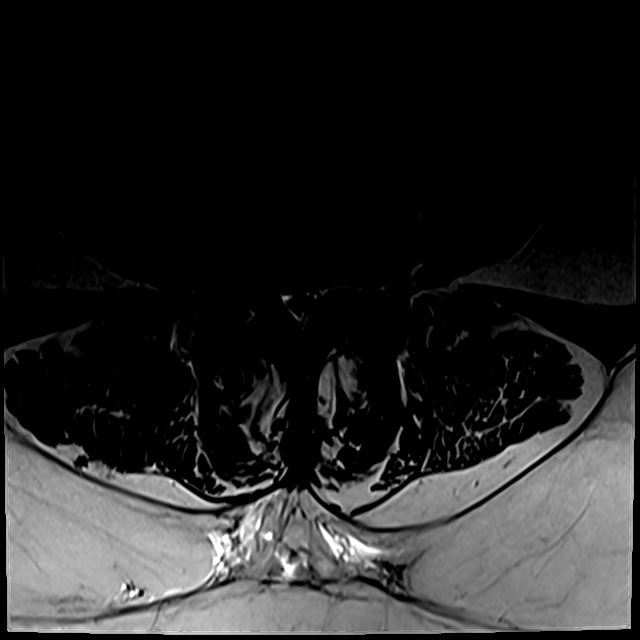
[im 17/39]
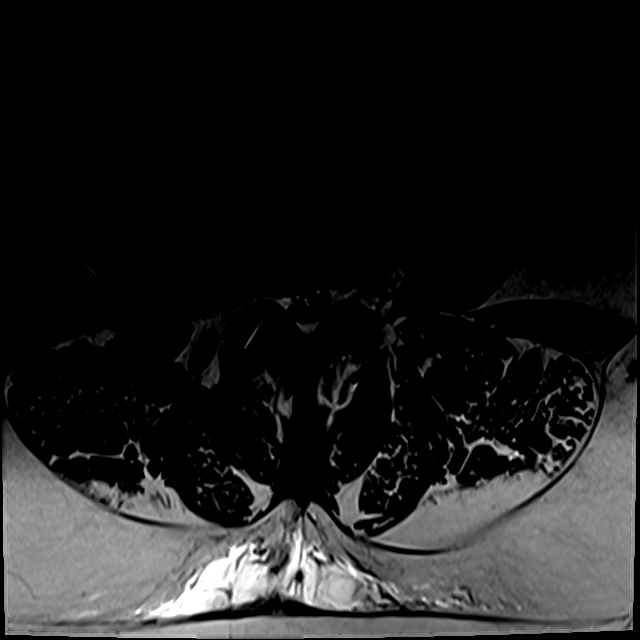
[im 20/39]
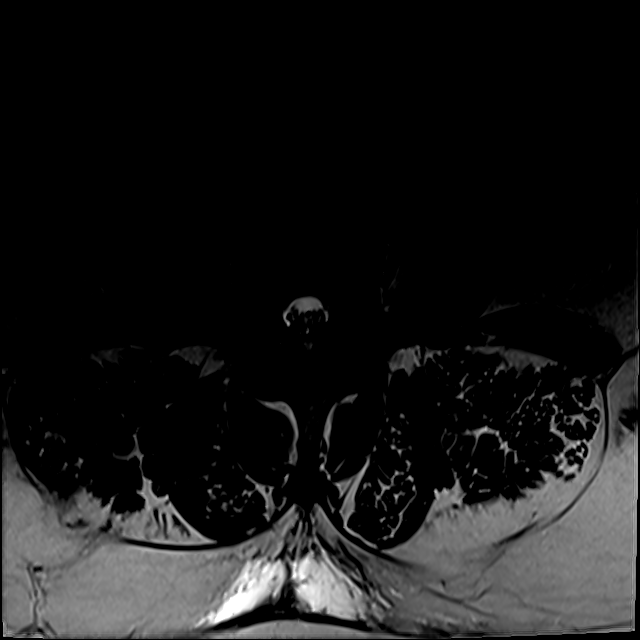
[im 33/39]
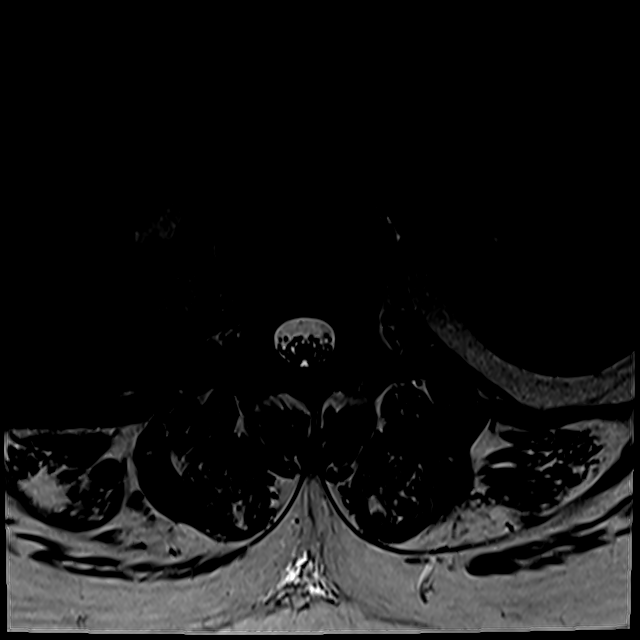

[18 of 48 positions shown; findings below may reference images not displayed]

FINDINGS: Segmentation:  Normal

Alignment:  Mild anterolisthesis L4-5.  Mild retrolisthesis L2-3.

Vertebrae: Negative for fracture or mass. Hemangioma L5 vertebral
body.

Conus medullaris: Extends to the L1-2 level and appears normal.

Paraspinal and other soft tissues: Negative

Disc levels:

L1-2:  Negative

L2-3: Diffuse disc bulging and mild facet degeneration. Mild spinal
stenosis.

L3-4: Diffuse disc bulging. Small right paracentral disc protrusion
with subarticular stenosis and possible impingement of the right L4
nerve root. Bilateral facet hypertrophy. Moderate spinal stenosis.

L4-5: 4 mm anterolisthesis. Diffuse disc bulging and severe facet
degeneration. Severe spinal stenosis. Extraforaminal disc protrusion
on the right with impingement of the right L4 nerve root.

L5-S1: Moderate facet hypertrophy bilaterally causing subarticular
stenosis and mild foraminal stenosis bilaterally.
IMPRESSION: Mild spinal stenosis L2-3

Moderate spinal stenosis L3-4. Small right paracentral disc
protrusion at L3-4 with possible impingement of the right L4 nerve
root

4 mm anterolisthesis L4-5 with severe spinal stenosis. Right
extraforaminal disc protrusion with impingement of the right L4
nerve root in the foramen.

Mild foraminal narrowing bilaterally L5-S1 due to spurring.

## 2017-07-30 MED FILL — METOPROLOL SUCCINATE ER 200: 200 | 90 days supply | Qty: 90 | Fill #1

## 2017-07-30 MED FILL — AMLODIPINE BESYLATE 5 MG TA: 5 | 90 days supply | Qty: 90 | Fill #1

## 2017-07-30 MED FILL — LISINOPRIL-HCTZ 20-25 MG TA: 20-25 | 90 days supply | Qty: 90 | Fill #1

## 2017-08-14 DIAGNOSIS — H401131 Primary open-angle glaucoma, bilateral, mild stage: Secondary | ICD-10-CM | POA: Diagnosis not present

## 2017-11-11 MED FILL — METOPROLOL SUCCINATE ER 200: 200 | 90 days supply | Qty: 90 | Fill #1

## 2017-11-11 MED FILL — AMLODIPINE BESYLATE 5 MG TA: 5 | 90 days supply | Qty: 90 | Fill #1

## 2017-11-11 MED FILL — LISINOPRIL-HCTZ 20-25 MG TA: 20-25 | 90 days supply | Qty: 90 | Fill #1

## 2018-01-10 DIAGNOSIS — H401131 Primary open-angle glaucoma, bilateral, mild stage: Secondary | ICD-10-CM | POA: Diagnosis not present

## 2018-02-12 ENCOUNTER — Encounter: Payer: Self-pay | Admitting: Family Medicine

## 2018-02-12 ENCOUNTER — Ambulatory Visit (INDEPENDENT_AMBULATORY_CARE_PROVIDER_SITE_OTHER): Payer: 59 | Admitting: Family Medicine

## 2018-02-12 ENCOUNTER — Other Ambulatory Visit: Payer: Self-pay

## 2018-02-12 VITALS — BP 112/70 | HR 69 | Temp 98.0°F | Wt 340.0 lb

## 2018-02-12 DIAGNOSIS — Z23 Encounter for immunization: Secondary | ICD-10-CM

## 2018-02-12 DIAGNOSIS — M545 Low back pain, unspecified: Secondary | ICD-10-CM

## 2018-02-12 DIAGNOSIS — I10 Essential (primary) hypertension: Secondary | ICD-10-CM | POA: Diagnosis not present

## 2018-02-12 DIAGNOSIS — M79604 Pain in right leg: Secondary | ICD-10-CM

## 2018-02-12 DIAGNOSIS — E119 Type 2 diabetes mellitus without complications: Secondary | ICD-10-CM | POA: Diagnosis not present

## 2018-02-12 DIAGNOSIS — R7309 Other abnormal glucose: Secondary | ICD-10-CM | POA: Diagnosis not present

## 2018-02-12 LAB — POCT GLYCOSYLATED HEMOGLOBIN (HGB A1C): HbA1c, POC (controlled diabetic range): 6.3 % (ref 0.0–7.0)

## 2018-02-12 MED ORDER — AMLODIPINE BESYLATE 5 MG PO TABS: 5.0000 mg | ORAL_TABLET | Freq: Every day | ORAL | 1 refills | Status: DC

## 2018-02-12 MED ORDER — ROPINIROLE HCL 2 MG PO TABS: 2.0000 mg | ORAL_TABLET | Freq: Every day | ORAL | 3 refills | Status: DC

## 2018-02-12 MED ORDER — LISINOPRIL-HYDROCHLOROTHIAZIDE 20-25 MG PO TABS: 1.0000 | ORAL_TABLET | Freq: Every day | ORAL | 1 refills | Status: DC

## 2018-02-12 MED ORDER — METFORMIN HCL 500 MG PO TABS: 500.0000 mg | ORAL_TABLET | Freq: Two times a day (BID) | ORAL | 11 refills | Status: DC

## 2018-02-12 MED ORDER — METOPROLOL SUCCINATE ER 200 MG PO TB24: 200.0000 mg | ORAL_TABLET | Freq: Every day | ORAL | 1 refills | Status: DC

## 2018-02-12 MED FILL — METOPROLOL SUCCINATE ER 200: 200 | 90 days supply | Qty: 90 | Fill #0

## 2018-02-12 MED FILL — LISINOPRIL-HCTZ 20-25 MG TA: 20-25 | 90 days supply | Qty: 90 | Fill #0

## 2018-02-12 MED FILL — rOPINIRole HCL 2 MG TABS: 2 | 30 days supply | Qty: 30 | Fill #0

## 2018-02-12 MED FILL — metFORMIN HCL 500 MG TABS: 500 | 30 days supply | Qty: 60 | Fill #0

## 2018-02-12 MED FILL — AMLODIPINE BESYLATE 5 MG TA: 5 | 90 days supply | Qty: 90 | Fill #0

## 2018-02-12 NOTE — Assessment & Plan Note (Signed)
Patient's pain is causing her significant distress.  She has tried many conservative measures without relief.  Her pain could be a combination of radicular pain from her disc herniation and restless leg syndrome, so we will try ropinirole 2 mg nightly and titrate up.  She may also benefit from amitriptyline for its combination of sedating and analgesic effects in the future, but we will see how the ropinirole works first.  Also encouraged patient to continue with her weight loss efforts, since this will also alleviate her leg pain.  She may benefit from bariatric surgery in the future, but will try lifestyle modification first.  Back surgery is likely not a good option for her currently due to her obesity, and patient is aware of this.

## 2018-02-12 NOTE — Assessment & Plan Note (Signed)
Discussed with patient that she could start metformin today if she would like a little extra help with glycemic control in addition to her diet changes.  She is amenable to this, and she will start metformin 500 mg once per day for the next 2 weeks then titrate up to 1000 mg/day.  Patient is aware that this medication because it can cause diarrhea if the dose is too high at the outset.  She is also going to try the keto diet again and thinks that she can sustain these dietary changes.  Encouraged patient to return to discuss how her diet change is going in the near future.

## 2018-02-12 NOTE — Patient Instructions (Addendum)
It was nice meeting you today Misty Maxwell!  For your leg pain, we will try a medicine that is often used for restless leg syndrome.  We will try the lowest dose now and increase as needed.  If this does not work, I think we can also try a medicine that will relieve your pain and also make you feel a little bit sleepy, which would probably be beneficial to you for nighttime.  We are starting metformin 500 mg today.  Please take 1 pill/day for the following 2 weeks and then take 2 pills/day after that.  Please let me know if you have any difficulties with this medication.  Good luck with your weight loss efforts.  Any weight that you lose will make your leg feel better as well.  I would like to follow-up in about 1 or 2 months to discuss your weight loss progress.  I will contact you if any of your labs are abnormal.  If you have any questions or concerns, please feel free to call the clinic.   Be well,  Dr. Shan Levans

## 2018-02-12 NOTE — Progress Notes (Signed)
Subjective:    Misty Maxwell - 53 y.o. female MRN [id number]  Date of birth: 1965/06/17  CC:  Misty Maxwell is here for diabetes follow up.  She would also like to discuss her neuropathy.  HPI: R leg pain - started after picking up a heavy container of ice in 2009 and was exacerbated by moving something while sitting in June 2018 - MRI shows L4 disc herniation - worse at night - it is the worst it's ever been currently - has been to physical therapy and chiropractor - she has numbness and can walk, but it worsens with inactivity, including lying in bed - pain is described as tightening of the skin and pin pricks - has done home exercises that were recommended to her, as well as creams, injections in her back, but nothing is working.  Gabapentin is also not effective, so she has stopped  Diabetes - lost 24 lbs on the keto diet - started eating bagels at work and has gained the weight back - would like to start back on this diet because she thinks she can stick to it  Health Maintenance:  Health Maintenance Due  Topic Date Due  . OPHTHALMOLOGY EXAM  05/03/1975    -  reports that she has never smoked. She has never used smokeless tobacco. - Review of Systems: Per HPI. - Past Medical History: Patient Active Problem List   Diagnosis Date Noted  . Type 2 diabetes mellitus without complication, without long-term current use of insulin ([place]) 02/12/2018  . Bilateral leg pain 04/03/2017  . Health care maintenance 09/04/2013  . KNEE PAIN, BILATERAL 08/11/2007  . PARESTHESIA 08/11/2007  . Low back pain radiating to right leg 08/04/2007  . OBESITY, MORBID 03/28/2006  . GLAUCOMA 03/28/2006  . HYPERTENSION, BENIGN SYSTEMIC 03/28/2006   - Medications: reviewed and updated   Objective:   Physical Exam BP 112/70   Pulse 69   Temp 98 F (36.7 C) (Oral)   Wt (!) 340 lb (154.2 kg)   LMP 09/29/2010   SpO2 97%   BMI 51.70 kg/m  Gen: NAD, alert, cooperative with exam,  well-appearing, obese, pleasant CV: RRR, good S1/S2, no murmur Resp: CTABL, no wheezes, non-labored Skin: Thickened, dusky areas on bilateral lower legs indicative of venous stasis Neuro: no gross deficits.  Psych: good insight, alert and oriented        Assessment & Plan:   Type 2 diabetes mellitus without complication, without long-term current use of insulin (HCC) Discussed with patient that she could start metformin today if she would like a little extra help with glycemic control in addition to her diet changes.  She is amenable to this, and she will start metformin 500 mg once per day for the next 2 weeks then titrate up to 1000 mg/day.  Patient is aware that this medication because it can cause diarrhea if the dose is too high at the outset.  She is also going to try the keto diet again and thinks that she can sustain these dietary changes.  Encouraged patient to return to discuss how her diet change is going in the near future.  Low back pain radiating to right leg Patient's pain is causing her significant distress.  She has tried many conservative measures without relief.  Her pain could be a combination of radicular pain from her disc herniation and restless leg syndrome, so we will try ropinirole 2 mg nightly and titrate up.  She may also benefit from amitriptyline  for its combination of sedating and analgesic effects in the future, but we will see how the ropinirole works first.  Also encouraged patient to continue with her weight loss efforts, since this will also alleviate her leg pain.  She may benefit from bariatric surgery in the future, but will try lifestyle modification first.  Back surgery is likely not a good option for her currently due to her obesity, and patient is aware of this.    Maia Breslow, M.D. 02/12/2018, 3:38 PM PGY-2, [place]

## 2018-02-13 ENCOUNTER — Telehealth: Payer: Self-pay | Admitting: Family Medicine

## 2018-02-13 LAB — LIPID PANEL
Chol/HDL Ratio: 3.5 ratio (ref 0.0–4.4)
Cholesterol, Total: 197 mg/dL (ref 100–199)
HDL: 57 mg/dL (ref >39)
LDL CALC: 126 mg/dL — AB (ref 0–99)
Triglycerides: 70 mg/dL (ref 0–149)
VLDL CHOLESTEROL CAL: 14 mg/dL (ref 5–40)

## 2018-02-13 LAB — COMPREHENSIVE METABOLIC PANEL
ALBUMIN: 4.3 g/dL (ref 3.5–5.5)
ALT: 19 IU/L (ref 0–32)
AST: 27 IU/L (ref 0–40)
Albumin/Globulin Ratio: 1.4 (ref 1.2–2.2)
Alkaline Phosphatase: 77 IU/L (ref 39–117)
BUN / CREAT RATIO: 29 — AB (ref 9–23)
BUN: 21 mg/dL (ref 6–24)
Bilirubin Total: 0.4 mg/dL (ref 0.0–1.2)
CALCIUM: 10.1 mg/dL (ref 8.7–10.2)
CHLORIDE: 100 mmol/L (ref 96–106)
CO2: 25 mmol/L (ref 20–29)
CREATININE: 0.72 mg/dL (ref 0.57–1.00)
GFR, EST AFRICAN AMERICAN: 111 mL/min/{1.73_m2} (ref >59)
GFR, EST NON AFRICAN AMERICAN: 97 mL/min/{1.73_m2} (ref >59)
GLUCOSE: 94 mg/dL (ref 65–99)
Globulin, Total: 3.1 g/dL (ref 1.5–4.5)
Potassium: 4.2 mmol/L (ref 3.5–5.2)
Sodium: 140 mmol/L (ref 134–144)
TOTAL PROTEIN: 7.4 g/dL (ref 6.0–8.5)

## 2018-02-13 LAB — CBC
Hematocrit: 45.1 % (ref 34.0–46.6)
Hemoglobin: 14.5 g/dL (ref 11.1–15.9)
MCH: 30.1 pg (ref 26.6–33.0)
MCHC: 32.2 g/dL (ref 31.5–35.7)
MCV: 94 fL (ref 79–97)
PLATELETS: 207 10*3/uL (ref 150–450)
RBC: 4.81 x10E6/uL (ref 3.77–5.28)
RDW: 13.3 % (ref 11.7–15.4)
WBC: 7.1 10*3/uL (ref 3.4–10.8)

## 2018-02-13 NOTE — Telephone Encounter (Signed)
Left voicemail informing patient that her LDL is a little elevated, and she may benefit from a statin to reduce her risk of heart attack and stroke, especially given her concomitant diagnosis of type 2 diabetes.  Asked patient to call the clinic and let us know she would like for me to send in medication to her pharmacy or just continue to observe her cholesterol.  Also informed her that her BMP and CBC were normal.

## 2018-03-20 MED FILL — DORZOLAMIDE-TIMOLOL EYE DRP: 22.3-6.8 | 50 days supply | Qty: 10 | Fill #0

## 2018-03-20 MED FILL — metFORMIN HCL 500 MG TABS: 500 | 30 days supply | Qty: 60 | Fill #1

## 2018-04-16 MED FILL — metFORMIN HCL 500 MG TABS: 500 | 30 days supply | Qty: 60 | Fill #2 | Status: TO

## 2018-05-14 MED FILL — LISINOPRIL-HCTZ 20-25 MG TA: 20-25 | 90 days supply | Qty: 90 | Fill #0

## 2018-05-14 MED FILL — METOPROLOL SUCCINATE ER 200: 200 | 90 days supply | Qty: 90 | Fill #0

## 2018-05-14 MED FILL — AMLODIPINE BESYLATE 5 MG TA: 5 | 90 days supply | Qty: 90 | Fill #0

## 2018-05-14 MED FILL — metFORMIN HCL 500 MG TABS: 500 | 30 days supply | Qty: 60 | Fill #0

## 2018-05-27 ENCOUNTER — Encounter: Payer: Self-pay | Admitting: Family Medicine

## 2018-06-17 MED FILL — metFORMIN HCL 500 MG TABS: 500 | 30 days supply | Qty: 60 | Fill #1

## 2018-07-18 ENCOUNTER — Other Ambulatory Visit: Payer: Self-pay | Admitting: Family Medicine

## 2018-07-18 DIAGNOSIS — Z1231 Encounter for screening mammogram for malignant neoplasm of breast: Secondary | ICD-10-CM

## 2018-07-24 ENCOUNTER — Other Ambulatory Visit: Payer: Self-pay

## 2018-07-24 MED ORDER — METFORMIN HCL 500 MG PO TABS: 500.0000 mg | ORAL_TABLET | Freq: Two times a day (BID) | ORAL | 3 refills | Status: DC

## 2018-07-24 MED FILL — metFORMIN HCL 500 MG TABS: 500 | 45 days supply | Qty: 90 | Fill #0

## 2018-07-24 NOTE — Telephone Encounter (Signed)
Pt requesting a 90 day supply. Ottis Stain, CMA

## 2018-07-29 DIAGNOSIS — H52201 Unspecified astigmatism, right eye: Secondary | ICD-10-CM | POA: Diagnosis not present

## 2018-07-29 LAB — HM DIABETES EYE EXAM

## 2018-08-27 ENCOUNTER — Other Ambulatory Visit: Payer: Self-pay | Admitting: Family Medicine

## 2018-08-27 DIAGNOSIS — I10 Essential (primary) hypertension: Secondary | ICD-10-CM

## 2018-08-27 MED FILL — AMLODIPINE BESYLATE 5 MG TA: 5 | 90 days supply | Qty: 90 | Fill #0

## 2018-08-27 MED FILL — METOPROLOL SUCCINATE ER 200: 200 | 90 days supply | Qty: 90 | Fill #0

## 2018-08-27 MED FILL — LISINOPRIL-HCTZ 20-25 MG TA: 20-25 | 90 days supply | Qty: 90 | Fill #0

## 2018-08-28 ENCOUNTER — Ambulatory Visit
Admission: RE | Admit: 2018-08-28 | Discharge: 2018-08-28 | Disposition: A | Discharge status: Home / Self Care | Payer: 59 | Source: Ambulatory Visit | Attending: Hematology and Oncology | Admitting: Hematology and Oncology

## 2018-08-28 ENCOUNTER — Other Ambulatory Visit: Payer: Self-pay

## 2018-08-28 DIAGNOSIS — Z1231 Encounter for screening mammogram for malignant neoplasm of breast: Secondary | ICD-10-CM

## 2018-08-29 ENCOUNTER — Other Ambulatory Visit: Payer: Self-pay

## 2018-08-29 ENCOUNTER — Ambulatory Visit (INDEPENDENT_AMBULATORY_CARE_PROVIDER_SITE_OTHER): Payer: 59 | Admitting: Family Medicine

## 2018-08-29 ENCOUNTER — Encounter: Payer: Self-pay | Admitting: Family Medicine

## 2018-08-29 VITALS — Wt 325.6 lb

## 2018-08-29 DIAGNOSIS — E119 Type 2 diabetes mellitus without complications: Secondary | ICD-10-CM | POA: Diagnosis not present

## 2018-08-29 DIAGNOSIS — M79604 Pain in right leg: Secondary | ICD-10-CM

## 2018-08-29 DIAGNOSIS — M545 Low back pain, unspecified: Secondary | ICD-10-CM

## 2018-08-29 LAB — POCT GLYCOSYLATED HEMOGLOBIN (HGB A1C): HbA1c, POC (controlled diabetic range): 6 % (ref 0.0–7.0)

## 2018-08-29 MED ORDER — DULOXETINE HCL 60 MG PO CPEP: 60.0000 mg | ORAL_CAPSULE | Freq: Every day | ORAL | 2 refills | Status: DC

## 2018-08-29 MED FILL — DULoxetine HCL 60 MG CPEP: 60 | 30 days supply | Qty: 30 | Fill #0

## 2018-08-29 MED FILL — metFORMIN HCL 500 MG TABS: 500 | 45 days supply | Qty: 90 | Fill #1

## 2018-08-29 NOTE — Progress Notes (Addendum)
   Subjective:    Misty Maxwell - 53 y.o. female MRN [id number]  Date of birth: 06-12-1965  CC:  Misty Maxwell is here for f/u of diabetes.  She would also like to discuss her back pain with radiculopathy.  HPI: Type 2 Diabetes Mellitus Patient has recorded her fasting blood sugar since April and has brought them to clinic.  They have ranged from 90 to 200, but results over 120 are rare.  She denies hypoglycemic symptoms.  She continues to take metformin 500 mg twice daily.  She has been working on weight loss and has lost about 15 pounds recently.  Radicular back pain Reports that she has had right-sided radiculopathy from a L4 level herniated disc that was found on MRI since 2017.  She has tried gabapentin, physical therapy, and steroid shots without relief.  She understands that weight loss may help alleviate her symptoms and is working on that currently.  She is wondering if surgery could be an option for her.   Health Maintenance:  Health Maintenance Due  Topic Date Due  . OPHTHALMOLOGY EXAM  05/03/1975  . INFLUENZA VACCINE  08/30/2018    -  reports that she has never smoked. She has never used smokeless tobacco. - Review of Systems: Per HPI. - Past Medical History: Patient Active Problem List   Diagnosis Date Noted  . Type 2 diabetes mellitus without complication, without long-term current use of insulin ([place]) 02/12/2018  . Bilateral leg pain 04/03/2017  . Health care maintenance 09/04/2013  . KNEE PAIN, BILATERAL 08/11/2007  . PARESTHESIA 08/11/2007  . Low back pain radiating to right leg 08/04/2007  . OBESITY, MORBID 03/28/2006  . GLAUCOMA 03/28/2006  . HYPERTENSION, BENIGN SYSTEMIC 03/28/2006   - Medications: reviewed and updated   Objective:   Physical Exam Wt (!) 325 lb 9.6 oz (147.7 kg)   LMP 09/29/2010   BMI 49.51 kg/m  Gen: NAD, alert, cooperative with exam, well-appearing, pleasant, obese HEENT: NCAT, clear conjunctiva, supple neck Skin: no rashes,  normal turgor  Neuro: Normal gait, positive straight leg test on the right, intact sensation to light touch on bilateral lower extremities Psych: good insight, alert and oriented    Assessment & Plan:   Type 2 diabetes mellitus without complication, without long-term current use of insulin (HCC) Well-controlled.  Hemoglobin A1c is 6.0.  Congratulated patient on her continued hard work on managing her diabetes.  Will continue metformin 500 mg twice daily.  Low back pain radiating to right leg Patient symptoms are consistent with MRI results from 2018 showing herniated disc to the right at L4.  Since she has tried several conservative treatments without relief of her symptoms, I think it is appropriate to refer to neurosurgery for their evaluation.  We discussed that back surgery can sometimes yield poor outcomes, and patient understands this risk.    Maia Breslow, M.D. 08/30/2018, 8:20 PM PGY-3, [place]

## 2018-08-29 NOTE — Patient Instructions (Addendum)
It was nice seeing you today Ms. Marley!  You are doing a great job controlling your diabetes.  Your hemoglobin A1c is 6.0 today.  Your blood sugar readings look good overall as well.  I am including some information on sleep hygiene, which will help you sleep better.  This does include reducing cell phone use especially before bedtime.  I am referring you to neurosurgery today since you have tried so many other options that have not worked for you for your sciatica.  I will also prescribe duloxetine, and you should take this once daily.  Please let me know if this medication is helpful.  I like to see you back in about 6 months  If you have any questions or concerns, please feel free to call the clinic.   Be well,  Dr. Shan Levans

## 2018-08-30 NOTE — Assessment & Plan Note (Signed)
Well-controlled.  Hemoglobin A1c is 6.0.  Congratulated patient on her continued hard work on managing her diabetes.  Will continue metformin 500 mg twice daily.

## 2018-08-30 NOTE — Assessment & Plan Note (Addendum)
Patient symptoms are consistent with MRI results from 2018 showing herniated disc to the right at L4.  Since she has tried several conservative treatments without relief of her symptoms, I think it is appropriate to refer to neurosurgery for their evaluation.  We discussed that back surgery can sometimes yield poor outcomes, and patient understands this risk.

## 2018-09-01 ENCOUNTER — Encounter: Payer: Self-pay | Admitting: Family Medicine

## 2018-09-23 DIAGNOSIS — Z6841 Body Mass Index (BMI) 40.0 and over, adult: Secondary | ICD-10-CM | POA: Diagnosis not present

## 2018-09-23 DIAGNOSIS — M4310 Spondylolisthesis, site unspecified: Secondary | ICD-10-CM | POA: Diagnosis not present

## 2018-09-23 DIAGNOSIS — M5416 Radiculopathy, lumbar region: Secondary | ICD-10-CM | POA: Diagnosis not present

## 2018-09-23 DIAGNOSIS — I10 Essential (primary) hypertension: Secondary | ICD-10-CM | POA: Diagnosis not present

## 2018-09-26 ENCOUNTER — Other Ambulatory Visit: Payer: Self-pay | Admitting: Neurosurgery

## 2018-09-26 DIAGNOSIS — M4310 Spondylolisthesis, site unspecified: Secondary | ICD-10-CM

## 2018-09-26 MED FILL — DULoxetine HCL 60 MG CPEP: 60 | 30 days supply | Qty: 30 | Fill #1

## 2018-10-04 ENCOUNTER — Other Ambulatory Visit: Payer: Self-pay | Admitting: Family Medicine

## 2018-10-04 MED ORDER — FREESTYLE SYSTEM KIT: 1.0000 | PACK | 1 refills | Status: DC | PRN

## 2018-10-04 MED ORDER — FREESTYLE TEST VI STRP: ORAL_STRIP | 12 refills | Status: DC

## 2018-10-04 NOTE — Progress Notes (Signed)
Glucometer and strips

## 2018-10-07 MED FILL — FREESTYLE LITE METER: 30 days supply | Qty: 1 | Fill #0

## 2018-10-07 MED FILL — FREESTYLE LITE TEST STRIP: 90 days supply | Qty: 100 | Fill #0

## 2018-10-26 ENCOUNTER — Other Ambulatory Visit: Payer: Self-pay

## 2018-10-26 ENCOUNTER — Ambulatory Visit
Admission: RE | Admit: 2018-10-26 | Discharge: 2018-10-26 | Disposition: A | Discharge status: Home / Self Care | Payer: 59 | Source: Ambulatory Visit | Attending: Neurosurgery | Admitting: Neurosurgery

## 2018-10-26 DIAGNOSIS — M4310 Spondylolisthesis, site unspecified: Secondary | ICD-10-CM

## 2018-10-26 DIAGNOSIS — M5126 Other intervertebral disc displacement, lumbar region: Secondary | ICD-10-CM | POA: Diagnosis not present

## 2018-10-26 DIAGNOSIS — M48061 Spinal stenosis, lumbar region without neurogenic claudication: Secondary | ICD-10-CM | POA: Diagnosis not present

## 2018-10-29 DIAGNOSIS — H401131 Primary open-angle glaucoma, bilateral, mild stage: Secondary | ICD-10-CM | POA: Diagnosis not present

## 2018-11-06 ENCOUNTER — Other Ambulatory Visit: Payer: Self-pay | Admitting: Neurosurgery

## 2018-11-06 DIAGNOSIS — M4310 Spondylolisthesis, site unspecified: Secondary | ICD-10-CM | POA: Diagnosis not present

## 2018-11-06 DIAGNOSIS — Z6825 Body mass index (BMI) 25.0-25.9, adult: Secondary | ICD-10-CM | POA: Diagnosis not present

## 2018-11-07 MED FILL — metFORMIN HCL 500 MG TABS: 500 | 90 days supply | Qty: 180 | Fill #2

## 2018-11-12 DIAGNOSIS — M4316 Spondylolisthesis, lumbar region: Secondary | ICD-10-CM | POA: Diagnosis not present

## 2018-11-27 ENCOUNTER — Other Ambulatory Visit: Payer: Self-pay | Admitting: Family Medicine

## 2018-11-27 DIAGNOSIS — I10 Essential (primary) hypertension: Secondary | ICD-10-CM

## 2018-11-28 MED FILL — LISINOPRIL-HCTZ 20-25 MG TA: 20-25 | 90 days supply | Qty: 90 | Fill #0

## 2018-11-28 MED FILL — METOPROLOL SUCCINATE ER 200: 200 | 90 days supply | Qty: 90 | Fill #0

## 2018-11-28 MED FILL — AMLODIPINE BESYLATE 5 MG TA: 5 | 90 days supply | Qty: 90 | Fill #0

## 2018-12-19 NOTE — Pre-Procedure Instructions (Signed)
[place], [place] - [address] [facility] [address] [place] [address] Phone: [phone] Fax: [phone]  [id number] - [place], [place] [id number] [place] [id number] Phone: [phone] Fax: [phone]      Your procedure is scheduled on 12-30-18  Report to [facility] Main Entrance "A" at 6A.M., and check in at the Admitting office.  Call this number if you have problems the morning of surgery:  [phone]  Call [phone] if you have any questions prior to your surgery date Monday-Friday 8am-4pm    Remember:  Do not eat or drink after midnight the night before your surgery  Take these medicines the morning of surgery with A SIP OF WATER : amLODipine (NORVASC)  metoprolol (TOPROL-XL)  Follow your surgeon's instructions on when to stop Aspirin.  If no instructions were given by your surgeon then you will need to call the office to get those instructions.    7 days prior to surgery STOP taking any Aspirin (unless otherwise instructed by your surgeon), Aleve, Naproxen, Ibuprofen, Motrin, Advil, Goody's, BC's, all herbal medications, fish oil, and all vitamins.   WHAT DO I DO ABOUT MY DIABETES MEDICATION?   HOW TO MANAGE YOUR DIABETES BEFORE AND AFTER SURGERY  Why is it important to control my blood sugar before and after surgery? . Improving blood sugar levels before and after surgery helps healing and can limit problems. . A way of improving blood sugar control is eating a healthy diet by: o  Eating less sugar and carbohydrates o  Increasing activity/exercise o  Talking with your doctor about reaching your blood sugar goals . High blood sugars (greater than 180 mg/dL) can raise your risk of infections and slow your recovery, so you will need to focus on controlling your diabetes during the weeks before surgery. . Make sure that the  doctor who takes care of your diabetes knows about your planned surgery including the date and location.  How do I manage my blood sugar before surgery? . Check your blood sugar at least 4 times a day, starting 2 days before surgery, to make sure that the level is not too high or low. . Check your blood sugar the morning of your surgery when you wake up and every 2 hours until you get to the Short Stay unit. o If your blood sugar is less than 70 mg/dL, you will need to treat for low blood sugar: - Do not take insulin. - Treat a low blood sugar (less than 70 mg/dL) with  cup of clear juice (cranberry or apple), 4 glucose tablets, OR glucose gel. - Recheck blood sugar in 15 minutes after treatment (to make sure it is greater than 70 mg/dL). If your blood sugar is not greater than 70 mg/dL on recheck, call [phone] for further instructions. . Report your blood sugar to the short stay nurse when you get to Short Stay.  . If you are admitted to the hospital after surgery: o Your blood sugar will be checked by the staff and you will probably be given insulin after surgery (instead of oral diabetes medicines) to make sure you have good blood sugar levels. o The goal for blood sugar control after surgery is 80-180 mg/dL.   The Morning of Surgery  Do not wear jewelry, make-up or nail polish.  Do not wear lotions, powders, or perfumes/colognes, or deodorant  Do not  shave 48 hours prior to surgery.    Do not bring valuables to the hospital.  [facility] is not responsible for any belongings or valuables.  If you are a smoker, DO NOT Smoke 24 hours prior to surgery  If you wear a CPAP at night please bring your mask, tubing, and machine the morning of surgery   Remember that you must have someone to transport you home after your surgery, and remain with you for 24 hours if you are discharged the same day.   Please bring cases for contacts, glasses, hearing aids, dentures or bridgework because  it cannot be worn into surgery.    Leave your suitcase in the car.  After surgery it may be brought to your room.  For patients admitted to the hospital, discharge time will be determined by your treatment team.  Patients discharged the day of surgery will not be allowed to drive home.    Special instructions:   [place]- Preparing For Surgery  Before surgery, you can play an important role. Because skin is not sterile, your skin needs to be as free of germs as possible. You can reduce the number of germs on your skin by washing with CHG (chlorahexidine gluconate) Soap before surgery.  CHG is an antiseptic cleaner which kills germs and bonds with the skin to continue killing germs even after washing.    Oral Hygiene is also important to reduce your risk of infection.  Remember - BRUSH YOUR TEETH THE MORNING OF SURGERY WITH YOUR REGULAR TOOTHPASTE  Please do not use if you have an allergy to CHG or antibacterial soaps. If your skin becomes reddened/irritated stop using the CHG.  Do not shave (including legs and underarms) for at least 48 hours prior to first CHG shower. It is OK to shave your face.  Please follow these instructions carefully.   1. Shower the NIGHT BEFORE SURGERY and the MORNING OF SURGERY with CHG Soap.   2. If you chose to wash your hair, wash your hair first as usual with your normal shampoo.  3. After you shampoo, rinse your hair and body thoroughly to remove the shampoo.  4. Use CHG as you would any other liquid soap. You can apply CHG directly to the skin and wash gently with a scrungie or a clean washcloth.   5. Apply the CHG Soap to your body ONLY FROM THE NECK DOWN.  Do not use on open wounds or open sores. Avoid contact with your eyes, ears, mouth and genitals (private parts). Wash Face and genitals (private parts)  with your normal soap.   6. Wash thoroughly, paying special attention to the area where your surgery will be performed.  7. Thoroughly rinse  your body with warm water from the neck down.  8. DO NOT shower/wash with your normal soap after using and rinsing off the CHG Soap.  9. Pat yourself dry with a CLEAN TOWEL.  10. Wear CLEAN PAJAMAS to bed the night before surgery, wear comfortable clothes the morning of surgery  11. Place CLEAN SHEETS on your bed the night of your first shower and DO NOT SLEEP WITH PETS.    Day of Surgery:  Please shower the morning of surgery with the CHG soap Do not apply any deodorants/lotions. Please wear clean clothes to the hospital/surgery center.   Remember to brush your teeth WITH YOUR REGULAR TOOTHPASTE.   Please read over the  fact sheets that you were given.

## 2018-12-22 ENCOUNTER — Other Ambulatory Visit: Payer: Self-pay

## 2018-12-22 ENCOUNTER — Encounter (HOSPITAL_COMMUNITY)
Admission: RE | Admit: 2018-12-22 | Discharge: 2018-12-22 | Disposition: A | Discharge status: Home / Self Care | Payer: 59 | Source: Ambulatory Visit | Attending: Neurosurgery | Admitting: Neurosurgery

## 2018-12-22 ENCOUNTER — Encounter (HOSPITAL_COMMUNITY): Payer: Self-pay

## 2018-12-22 DIAGNOSIS — Z01818 Encounter for other preprocedural examination: Secondary | ICD-10-CM | POA: Insufficient documentation

## 2018-12-22 DIAGNOSIS — I498 Other specified cardiac arrhythmias: Secondary | ICD-10-CM | POA: Insufficient documentation

## 2018-12-22 HISTORY — DX: Type 2 diabetes mellitus without complications: E11.9

## 2018-12-22 LAB — BASIC METABOLIC PANEL
Anion gap: 11 (ref 5–15)
BUN: 15 mg/dL (ref 6–20)
CO2: 26 mmol/L (ref 22–32)
Calcium: 9.7 mg/dL (ref 8.9–10.3)
Chloride: 105 mmol/L (ref 98–111)
Creatinine, Ser: 0.74 mg/dL (ref 0.44–1.00)
GFR calc Af Amer: >60 mL/min (ref >60)
GFR calc non Af Amer: >60 mL/min (ref >60)
Glucose, Bld: 105 mg/dL — ABNORMAL HIGH (ref 70–99)
Potassium: 4.1 mmol/L (ref 3.5–5.1)
Sodium: 142 mmol/L (ref 135–145)

## 2018-12-22 LAB — CBC WITH DIFFERENTIAL/PLATELET
Abs Immature Granulocytes: 0.02 10*3/uL (ref 0.00–0.07)
Basophils Absolute: 0 10*3/uL (ref 0.0–0.1)
Basophils Relative: 1 %
Eosinophils Absolute: 0.1 10*3/uL (ref 0.0–0.5)
Eosinophils Relative: 2 %
HCT: 46.2 % — ABNORMAL HIGH (ref 36.0–46.0)
Hemoglobin: 14.8 g/dL (ref 12.0–15.0)
Immature Granulocytes: 0 %
Lymphocytes Relative: 32 %
Lymphs Abs: 1.8 10*3/uL (ref 0.7–4.0)
MCH: 30.5 pg (ref 26.0–34.0)
MCHC: 32 g/dL (ref 30.0–36.0)
MCV: 95.3 fL (ref 80.0–100.0)
Monocytes Absolute: 0.7 10*3/uL (ref 0.1–1.0)
Monocytes Relative: 12 %
Neutro Abs: 3.1 10*3/uL (ref 1.7–7.7)
Neutrophils Relative %: 53 %
Platelets: 209 10*3/uL (ref 150–400)
RBC: 4.85 MIL/uL (ref 3.87–5.11)
RDW: 14.2 % (ref 11.5–15.5)
WBC: 5.8 10*3/uL (ref 4.0–10.5)
nRBC: 0 % (ref 0.0–0.2)

## 2018-12-22 LAB — SURGICAL PCR SCREEN
MRSA, PCR: NEGATIVE
Staphylococcus aureus: NEGATIVE

## 2018-12-22 LAB — HEMOGLOBIN A1C
Hgb A1c MFr Bld: 6.2 % — ABNORMAL HIGH (ref 4.8–5.6)
Mean Plasma Glucose: 131.24 mg/dL

## 2018-12-22 LAB — GLUCOSE, CAPILLARY: Glucose-Capillary: 122 mg/dL — ABNORMAL HIGH (ref 70–99)

## 2018-12-22 LAB — TYPE AND SCREEN
ABO/RH(D): AB POS
Antibody Screen: NEGATIVE

## 2018-12-22 NOTE — Progress Notes (Signed)
PCP - Aram Candela @ [place] Cardiologist - na  Chest x-ray - na EKG - today Stress Test - na ECHO - na Cardiac Cath - na  Sleep Study - na CPAP -   Fasting Blood Sugar - 135 Checks Blood Sugar ___once every few days  Blood Thinner Instructions: Aspirin Instructions:5-7 days per md  ERAS Protcol -na PRE-SURGERY Ensure or G2-   COVID TEST- 12/26/18  Anesthesia review:   Patient denies shortness of breath, fever, cough and chest pain at PAT appointment   All instructions explained to the patient, with a verbal understanding of the material. Patient agrees to go over the instructions while at home for a better understanding. Patient also instructed to self quarantine after being tested for COVID-19. The opportunity to ask questions was provided.

## 2018-12-23 LAB — ABO/RH: ABO/RH(D): AB POS

## 2018-12-24 ENCOUNTER — Other Ambulatory Visit: Payer: Self-pay | Admitting: *Deleted

## 2018-12-24 NOTE — Patient Outreach (Signed)
[facility]) Care Management  12/24/2018  Misty Maxwell 1965-03-18 [id number]   Preoperative Screening Call  Referral received: 12/24/18 Surgery/Procedure date: 12/30/18 Initial outreach: 12/24/18 Insurance: [employer]   Initial unsuccessful telephone call to patient's preferred number in order to complete  preoperative screening; no answer, left HIPAA compliant voicemail message requesting return call.   Objective: Per the electronic medical record Misty Maxwell , is scheduled for PLIF- L4-L5 on 12/1 /20 at [facility]. She completed her  pre-operative admission testing visit on 12/22/18 . Comorbidities include: Type 2 Diabetes (A1c, 6.2 on 12/22/18)  obesity , hypertension , Glaucoma.   Plan: If patient does not return call today, this RNCM will call patient for transition of care outreach within 72 hours of hospital discharge notification.   Joylene Draft, RN, [place] Management Coordinator  [phone]- Mobile [phone]- Toll Free Main Office

## 2018-12-26 ENCOUNTER — Other Ambulatory Visit (HOSPITAL_COMMUNITY)
Admission: RE | Admit: 2018-12-26 | Discharge: 2018-12-26 | Disposition: A | Discharge status: Home / Self Care | Payer: 59 | Source: Ambulatory Visit | Attending: Neurosurgery | Admitting: Neurosurgery

## 2018-12-26 DIAGNOSIS — Z01812 Encounter for preprocedural laboratory examination: Secondary | ICD-10-CM | POA: Diagnosis not present

## 2018-12-26 DIAGNOSIS — Z20828 Contact with and (suspected) exposure to other viral communicable diseases: Secondary | ICD-10-CM | POA: Diagnosis not present

## 2018-12-26 LAB — SARS CORONAVIRUS 2 (TAT 6-24 HRS): SARS Coronavirus 2: NEGATIVE

## 2018-12-29 MED ORDER — DEXTROSE 5 % IV SOLN
3.0000 g | INTRAVENOUS | Status: AC
  Administered 2018-12-30: 3 g via INTRAVENOUS
  Filled 2018-12-29: qty 3

## 2018-12-29 NOTE — Anesthesia Preprocedure Evaluation (Addendum)
Anesthesia Evaluation  Patient identified by MRN, date of birth, ID band Patient awake    Reviewed: Allergy & Precautions, NPO status , Patient's Chart, lab work & pertinent test results  Airway Mallampati: II  TM Distance: >3 FB Neck ROM: Full    Dental  (+) Teeth Intact, Dental Advisory Given   Pulmonary    breath sounds clear to auscultation       Cardiovascular hypertension,  Rhythm:Regular Rate:Normal     Neuro/Psych    GI/Hepatic negative GI ROS, Neg liver ROS,   Endo/Other  diabetes  Renal/GU negative Renal ROS     Musculoskeletal  (+) Arthritis ,   Abdominal   Peds  Hematology   Anesthesia Other Findings   Reproductive/Obstetrics                           Anesthesia Physical Anesthesia Plan  ASA: III  Anesthesia Plan: General   Post-op Pain Management:    Induction: Intravenous  PONV Risk Score and Plan: 3 and Ondansetron and Dexamethasone  Airway Management Planned: Oral ETT and Video Laryngoscope Planned  Additional Equipment:   Intra-op Plan:   Post-operative Plan:   Informed Consent: I have reviewed the patients History and Physical, chart, labs and discussed the procedure including the risks, benefits and alternatives for the proposed anesthesia with the patient or authorized representative who has indicated his/her understanding and acceptance.     Dental advisory given  Plan Discussed with: CRNA and Anesthesiologist  Anesthesia Plan Comments:        Anesthesia Quick Evaluation

## 2018-12-30 ENCOUNTER — Encounter (HOSPITAL_COMMUNITY)
Admission: RE | Disposition: A | Discharge status: Home / Self Care | Payer: Self-pay | Source: Home / Self Care | Attending: Neurosurgery

## 2018-12-30 ENCOUNTER — Inpatient Hospital Stay (HOSPITAL_COMMUNITY): Payer: 59

## 2018-12-30 ENCOUNTER — Encounter (HOSPITAL_COMMUNITY): Payer: Self-pay | Admitting: *Deleted

## 2018-12-30 ENCOUNTER — Inpatient Hospital Stay (HOSPITAL_COMMUNITY): Payer: 59 | Admitting: Certified Registered Nurse Anesthetist

## 2018-12-30 ENCOUNTER — Other Ambulatory Visit: Payer: Self-pay

## 2018-12-30 ENCOUNTER — Inpatient Hospital Stay (HOSPITAL_COMMUNITY)
Admission: RE | Admit: 2018-12-30 | Discharge: 2018-12-31 | DRG: 454 | Disposition: A | Discharge status: Home / Self Care | Payer: 59 | Attending: Neurosurgery | Admitting: Neurosurgery

## 2018-12-30 DIAGNOSIS — Z7984 Long term (current) use of oral hypoglycemic drugs: Secondary | ICD-10-CM

## 2018-12-30 DIAGNOSIS — M4316 Spondylolisthesis, lumbar region: Secondary | ICD-10-CM | POA: Diagnosis not present

## 2018-12-30 DIAGNOSIS — Z981 Arthrodesis status: Secondary | ICD-10-CM | POA: Diagnosis not present

## 2018-12-30 DIAGNOSIS — Z7982 Long term (current) use of aspirin: Secondary | ICD-10-CM

## 2018-12-30 DIAGNOSIS — Z79890 Hormone replacement therapy (postmenopausal): Secondary | ICD-10-CM | POA: Diagnosis not present

## 2018-12-30 DIAGNOSIS — M48061 Spinal stenosis, lumbar region without neurogenic claudication: Secondary | ICD-10-CM | POA: Diagnosis present

## 2018-12-30 DIAGNOSIS — Z79899 Other long term (current) drug therapy: Secondary | ICD-10-CM

## 2018-12-30 DIAGNOSIS — Z791 Long term (current) use of non-steroidal anti-inflammatories (NSAID): Secondary | ICD-10-CM | POA: Diagnosis not present

## 2018-12-30 DIAGNOSIS — Z6841 Body Mass Index (BMI) 40.0 and over, adult: Secondary | ICD-10-CM | POA: Diagnosis not present

## 2018-12-30 DIAGNOSIS — M1990 Unspecified osteoarthritis, unspecified site: Secondary | ICD-10-CM | POA: Diagnosis present

## 2018-12-30 DIAGNOSIS — Z823 Family history of stroke: Secondary | ICD-10-CM | POA: Diagnosis not present

## 2018-12-30 DIAGNOSIS — I10 Essential (primary) hypertension: Secondary | ICD-10-CM | POA: Diagnosis present

## 2018-12-30 DIAGNOSIS — H409 Unspecified glaucoma: Secondary | ICD-10-CM | POA: Diagnosis present

## 2018-12-30 DIAGNOSIS — Z8249 Family history of ischemic heart disease and other diseases of the circulatory system: Secondary | ICD-10-CM

## 2018-12-30 DIAGNOSIS — M5116 Intervertebral disc disorders with radiculopathy, lumbar region: Secondary | ICD-10-CM | POA: Diagnosis not present

## 2018-12-30 DIAGNOSIS — E119 Type 2 diabetes mellitus without complications: Secondary | ICD-10-CM | POA: Diagnosis not present

## 2018-12-30 DIAGNOSIS — Z419 Encounter for procedure for purposes other than remedying health state, unspecified: Secondary | ICD-10-CM

## 2018-12-30 DIAGNOSIS — Z833 Family history of diabetes mellitus: Secondary | ICD-10-CM

## 2018-12-30 DIAGNOSIS — M4310 Spondylolisthesis, site unspecified: Secondary | ICD-10-CM | POA: Diagnosis present

## 2018-12-30 HISTORY — DX: Unspecified glaucoma: H40.9

## 2018-12-30 LAB — GLUCOSE, CAPILLARY
Glucose-Capillary: 103 mg/dL — ABNORMAL HIGH (ref 70–99)
Glucose-Capillary: 133 mg/dL — ABNORMAL HIGH (ref 70–99)
Glucose-Capillary: 169 mg/dL — ABNORMAL HIGH (ref 70–99)
Glucose-Capillary: 115 mg/dL — ABNORMAL HIGH (ref 70–99)

## 2018-12-30 SURGERY — POSTERIOR LUMBAR FUSION 1 LEVEL
Anesthesia: General | Site: Back

## 2018-12-30 MED ORDER — ASPIRIN 81 MG PO TABS: 81.0000 mg | ORAL_TABLET | Freq: Every day | ORAL | Status: DC

## 2018-12-30 MED ORDER — CHLORHEXIDINE GLUCONATE CLOTH 2 % EX PADS: 6.0000 | MEDICATED_PAD | Freq: Once | CUTANEOUS | Status: DC

## 2018-12-30 MED ORDER — HYDROCHLOROTHIAZIDE 25 MG PO TABS: 25.0000 mg | ORAL_TABLET | Freq: Every day | ORAL | Status: DC

## 2018-12-30 MED ORDER — AMLODIPINE BESYLATE 5 MG PO TABS: 5.0000 mg | ORAL_TABLET | Freq: Every day | ORAL | Status: DC

## 2018-12-30 MED ORDER — SODIUM CHLORIDE 0.9 % IV SOLN
INTRAVENOUS | Status: DC | PRN
  Administered 2018-12-30 (×2): 500 mL

## 2018-12-30 MED ORDER — LISINOPRIL-HYDROCHLOROTHIAZIDE 20-25 MG PO TABS: 1.0000 | ORAL_TABLET | Freq: Every day | ORAL | Status: DC

## 2018-12-30 MED ORDER — HYDROMORPHONE HCL 1 MG/ML IJ SOLN: 1.0000 mg | INTRAMUSCULAR | Status: DC | PRN

## 2018-12-30 MED ORDER — MIDAZOLAM HCL 5 MG/5ML IJ SOLN
INTRAMUSCULAR | Status: DC | PRN
  Administered 2018-12-30: 2 mg via INTRAVENOUS

## 2018-12-30 MED ORDER — SUCCINYLCHOLINE CHLORIDE 200 MG/10ML IV SOSY
PREFILLED_SYRINGE | INTRAVENOUS | Status: DC | PRN
  Administered 2018-12-30: 140 mg via INTRAVENOUS

## 2018-12-30 MED ORDER — ROCURONIUM BROMIDE 10 MG/ML (PF) SYRINGE
PREFILLED_SYRINGE | INTRAVENOUS | Status: AC
  Filled 2018-12-30: qty 10

## 2018-12-30 MED ORDER — PHENYLEPHRINE HCL-NACL 10-0.9 MG/250ML-% IV SOLN
INTRAVENOUS | Status: DC | PRN
  Administered 2018-12-30: 20 ug/min via INTRAVENOUS

## 2018-12-30 MED ORDER — FENTANYL CITRATE (PF) 250 MCG/5ML IJ SOLN
INTRAMUSCULAR | Status: DC | PRN
  Administered 2018-12-30: 150 ug via INTRAVENOUS
  Administered 2018-12-30 (×2): 50 ug via INTRAVENOUS

## 2018-12-30 MED ORDER — LIDOCAINE 2% (20 MG/ML) 5 ML SYRINGE
INTRAMUSCULAR | Status: DC | PRN
  Administered 2018-12-30: 80 mg via INTRAVENOUS

## 2018-12-30 MED ORDER — OXYCODONE HCL 5 MG PO TABS
10.0000 mg | ORAL_TABLET | ORAL | Status: DC | PRN
  Administered 2018-12-30 – 2018-12-31 (×5): 10 mg via ORAL
  Filled 2018-12-30 (×5): qty 2

## 2018-12-30 MED ORDER — MENTHOL 3 MG MT LOZG: 1.0000 | LOZENGE | OROMUCOSAL | Status: DC | PRN

## 2018-12-30 MED ORDER — DEXAMETHASONE SODIUM PHOSPHATE 10 MG/ML IJ SOLN
INTRAMUSCULAR | Status: DC | PRN
  Administered 2018-12-30: 10 mg via INTRAVENOUS

## 2018-12-30 MED ORDER — ARTIFICIAL TEARS OPHTHALMIC OINT
TOPICAL_OINTMENT | OPHTHALMIC | Status: AC
  Filled 2018-12-30: qty 3.5

## 2018-12-30 MED ORDER — ROCURONIUM BROMIDE 10 MG/ML (PF) SYRINGE
PREFILLED_SYRINGE | INTRAVENOUS | Status: DC | PRN
  Administered 2018-12-30: 20 mg via INTRAVENOUS
  Administered 2018-12-30: 50 mg via INTRAVENOUS
  Administered 2018-12-30: 20 mg via INTRAVENOUS
  Administered 2018-12-30: 10 mg via INTRAVENOUS

## 2018-12-30 MED ORDER — DEXAMETHASONE SODIUM PHOSPHATE 10 MG/ML IJ SOLN
INTRAMUSCULAR | Status: AC
  Filled 2018-12-30: qty 1

## 2018-12-30 MED ORDER — LATANOPROST 0.005 % OP SOLN
1.0000 [drp] | Freq: Every day | OPHTHALMIC | Status: DC
  Administered 2018-12-30: 1 [drp] via OPHTHALMIC
  Filled 2018-12-30: qty 2.5

## 2018-12-30 MED ORDER — VANCOMYCIN HCL 1000 MG IV SOLR
INTRAVENOUS | Status: AC
  Filled 2018-12-30: qty 1000

## 2018-12-30 MED ORDER — LACTATED RINGERS IV SOLN
INTRAVENOUS | Status: DC
  Administered 2018-12-30: 1000 mL via INTRAVENOUS

## 2018-12-30 MED ORDER — DEXAMETHASONE SODIUM PHOSPHATE 10 MG/ML IJ SOLN: 10.0000 mg | Freq: Once | INTRAMUSCULAR | Status: DC

## 2018-12-30 MED ORDER — SUGAMMADEX SODIUM 500 MG/5ML IV SOLN
INTRAVENOUS | Status: AC
  Filled 2018-12-30: qty 5

## 2018-12-30 MED ORDER — FLEET ENEMA 7-19 GM/118ML RE ENEM: 1.0000 | ENEMA | Freq: Once | RECTAL | Status: DC | PRN

## 2018-12-30 MED ORDER — SUCCINYLCHOLINE CHLORIDE 200 MG/10ML IV SOSY
PREFILLED_SYRINGE | INTRAVENOUS | Status: AC
  Filled 2018-12-30: qty 10

## 2018-12-30 MED ORDER — CEFAZOLIN SODIUM-DEXTROSE 1-4 GM/50ML-% IV SOLN
1.0000 g | Freq: Three times a day (TID) | INTRAVENOUS | Status: AC
  Administered 2018-12-30 – 2018-12-31 (×2): 1 g via INTRAVENOUS
  Filled 2018-12-30 (×2): qty 50

## 2018-12-30 MED ORDER — PROPOFOL 10 MG/ML IV BOLUS
INTRAVENOUS | Status: DC | PRN
  Administered 2018-12-30: 20 mg via INTRAVENOUS
  Administered 2018-12-30: 150 mg via INTRAVENOUS

## 2018-12-30 MED ORDER — DIAZEPAM 5 MG PO TABS
5.0000 mg | ORAL_TABLET | Freq: Four times a day (QID) | ORAL | Status: DC | PRN
  Administered 2018-12-30: 5 mg via ORAL
  Filled 2018-12-30: qty 1

## 2018-12-30 MED ORDER — THROMBIN 5000 UNITS EX SOLR
CUTANEOUS | Status: AC
  Filled 2018-12-30: qty 5000

## 2018-12-30 MED ORDER — PROPOFOL 10 MG/ML IV BOLUS
INTRAVENOUS | Status: AC
  Filled 2018-12-30: qty 20

## 2018-12-30 MED ORDER — HYDROMORPHONE HCL 1 MG/ML IJ SOLN
INTRAMUSCULAR | Status: AC
  Filled 2018-12-30: qty 1

## 2018-12-30 MED ORDER — ACETAMINOPHEN 325 MG PO TABS: 650.0000 mg | ORAL_TABLET | ORAL | Status: DC | PRN

## 2018-12-30 MED ORDER — HYDROMORPHONE HCL 1 MG/ML IJ SOLN
0.2500 mg | INTRAMUSCULAR | Status: DC | PRN
  Administered 2018-12-30 (×2): 0.5 mg via INTRAVENOUS

## 2018-12-30 MED ORDER — ARTIFICIAL TEARS OPHTHALMIC OINT
TOPICAL_OINTMENT | OPHTHALMIC | Status: DC | PRN
  Administered 2018-12-30: 1 via OPHTHALMIC

## 2018-12-30 MED ORDER — BUPIVACAINE HCL (PF) 0.25 % IJ SOLN
INTRAMUSCULAR | Status: DC | PRN
  Administered 2018-12-30: 30 mL

## 2018-12-30 MED ORDER — MIDAZOLAM HCL 2 MG/2ML IJ SOLN
INTRAMUSCULAR | Status: AC
  Filled 2018-12-30: qty 2

## 2018-12-30 MED ORDER — MELATONIN 3 MG PO TABS
9.0000 mg | ORAL_TABLET | Freq: Every evening | ORAL | Status: DC | PRN
  Filled 2018-12-30: qty 3

## 2018-12-30 MED ORDER — PHENOL 1.4 % MT LIQD
1.0000 | OROMUCOSAL | Status: DC | PRN
  Administered 2018-12-30: 1 via OROMUCOSAL
  Filled 2018-12-30: qty 177

## 2018-12-30 MED ORDER — ONDANSETRON HCL 4 MG/2ML IJ SOLN
INTRAMUSCULAR | Status: AC
  Filled 2018-12-30: qty 2

## 2018-12-30 MED ORDER — DORZOLAMIDE HCL-TIMOLOL MAL 2-0.5 % OP SOLN
1.0000 [drp] | Freq: Every day | OPHTHALMIC | Status: DC
  Administered 2018-12-30: 1 [drp] via OPHTHALMIC
  Filled 2018-12-30: qty 10

## 2018-12-30 MED ORDER — SUGAMMADEX SODIUM 500 MG/5ML IV SOLN
INTRAVENOUS | Status: DC | PRN
  Administered 2018-12-30: 300 mg via INTRAVENOUS

## 2018-12-30 MED ORDER — POLYETHYLENE GLYCOL 3350 17 G PO PACK: 17.0000 g | PACK | Freq: Every day | ORAL | Status: DC | PRN

## 2018-12-30 MED ORDER — METOPROLOL SUCCINATE ER 100 MG PO TB24: 200.0000 mg | ORAL_TABLET | Freq: Every day | ORAL | Status: DC

## 2018-12-30 MED ORDER — LIDOCAINE 2% (20 MG/ML) 5 ML SYRINGE
INTRAMUSCULAR | Status: AC
  Filled 2018-12-30: qty 5

## 2018-12-30 MED ORDER — SODIUM CHLORIDE 0.9% FLUSH
3.0000 mL | Freq: Two times a day (BID) | INTRAVENOUS | Status: DC
  Administered 2018-12-30: 3 mL via INTRAVENOUS

## 2018-12-30 MED ORDER — INSULIN ASPART 100 UNIT/ML ~~LOC~~ SOLN
0.0000 [IU] | Freq: Three times a day (TID) | SUBCUTANEOUS | Status: DC
  Administered 2018-12-30: 4 [IU] via SUBCUTANEOUS
  Administered 2018-12-31: 3 [IU] via SUBCUTANEOUS

## 2018-12-30 MED ORDER — ACETAMINOPHEN 650 MG RE SUPP: 650.0000 mg | RECTAL | Status: DC | PRN

## 2018-12-30 MED ORDER — HYDROCODONE-ACETAMINOPHEN 10-325 MG PO TABS: 1.0000 | ORAL_TABLET | ORAL | Status: DC | PRN

## 2018-12-30 MED ORDER — ONDANSETRON HCL 4 MG/2ML IJ SOLN
INTRAMUSCULAR | Status: DC | PRN
  Administered 2018-12-30: 4 mg via INTRAVENOUS

## 2018-12-30 MED ORDER — PHENYLEPHRINE 40 MCG/ML (10ML) SYRINGE FOR IV PUSH (FOR BLOOD PRESSURE SUPPORT)
PREFILLED_SYRINGE | INTRAVENOUS | Status: AC
  Filled 2018-12-30: qty 10

## 2018-12-30 MED ORDER — THROMBIN 20000 UNITS EX SOLR
CUTANEOUS | Status: AC
  Filled 2018-12-30: qty 20000

## 2018-12-30 MED ORDER — THROMBIN 20000 UNITS EX SOLR
CUTANEOUS | Status: DC | PRN
  Administered 2018-12-30: 20 mL via TOPICAL

## 2018-12-30 MED ORDER — SODIUM CHLORIDE 0.9% FLUSH: 3.0000 mL | INTRAVENOUS | Status: DC | PRN

## 2018-12-30 MED ORDER — VANCOMYCIN HCL 1000 MG IV SOLR
INTRAVENOUS | Status: DC | PRN
  Administered 2018-12-30: 1000 mg

## 2018-12-30 MED ORDER — PHENYLEPHRINE 40 MCG/ML (10ML) SYRINGE FOR IV PUSH (FOR BLOOD PRESSURE SUPPORT)
PREFILLED_SYRINGE | INTRAVENOUS | Status: DC | PRN
  Administered 2018-12-30: 80 ug via INTRAVENOUS
  Administered 2018-12-30: 40 ug via INTRAVENOUS
  Administered 2018-12-30: 80 ug via INTRAVENOUS
  Administered 2018-12-30: 40 ug via INTRAVENOUS
  Administered 2018-12-30: 80 ug via INTRAVENOUS

## 2018-12-30 MED ORDER — LISINOPRIL 20 MG PO TABS: 20.0000 mg | ORAL_TABLET | Freq: Every day | ORAL | Status: DC

## 2018-12-30 MED ORDER — 0.9 % SODIUM CHLORIDE (POUR BTL) OPTIME
TOPICAL | Status: DC | PRN
  Administered 2018-12-30: 1000 mL

## 2018-12-30 MED ORDER — LACTATED RINGERS IV SOLN
INTRAVENOUS | Status: DC | PRN
  Administered 2018-12-30 (×2): via INTRAVENOUS

## 2018-12-30 MED ORDER — ONDANSETRON HCL 4 MG/2ML IJ SOLN: 4.0000 mg | Freq: Four times a day (QID) | INTRAMUSCULAR | Status: DC | PRN

## 2018-12-30 MED ORDER — ONDANSETRON HCL 4 MG PO TABS: 4.0000 mg | ORAL_TABLET | Freq: Four times a day (QID) | ORAL | Status: DC | PRN

## 2018-12-30 MED ORDER — SODIUM CHLORIDE 0.9 % IV SOLN: 250.0000 mL | INTRAVENOUS | Status: DC

## 2018-12-30 MED ORDER — ASPIRIN EC 81 MG PO TBEC: 81.0000 mg | DELAYED_RELEASE_TABLET | Freq: Every day | ORAL | Status: DC

## 2018-12-30 MED ORDER — BUPIVACAINE HCL (PF) 0.25 % IJ SOLN
INTRAMUSCULAR | Status: AC
  Filled 2018-12-30: qty 30

## 2018-12-30 MED ORDER — METFORMIN HCL 500 MG PO TABS
500.0000 mg | ORAL_TABLET | Freq: Two times a day (BID) | ORAL | Status: DC
  Administered 2018-12-30 – 2018-12-31 (×2): 500 mg via ORAL
  Filled 2018-12-30 (×2): qty 1

## 2018-12-30 MED ORDER — FENTANYL CITRATE (PF) 250 MCG/5ML IJ SOLN
INTRAMUSCULAR | Status: AC
  Filled 2018-12-30: qty 5

## 2018-12-30 MED ORDER — BISACODYL 10 MG RE SUPP: 10.0000 mg | Freq: Every day | RECTAL | Status: DC | PRN

## 2018-12-30 SURGICAL SUPPLY — 63 items
ADH SKN CLS APL DERMABOND .7 (GAUZE/BANDAGES/DRESSINGS) ×1
APL SKNCLS STERI-STRIP NONHPOA (GAUZE/BANDAGES/DRESSINGS) ×1
BAG DECANTER FOR FLEXI CONT (MISCELLANEOUS) ×2 IMPLANT
BENZOIN TINCTURE PRP APPL 2/3 (GAUZE/BANDAGES/DRESSINGS) ×2 IMPLANT
BLADE CLIPPER SURG (BLADE) IMPLANT
BUR CUTTER 7.0 ROUND (BURR) IMPLANT
BUR MATCHSTICK NEURO 3.0 LAGG (BURR) ×2 IMPLANT
CANISTER SUCT 3000ML PPV (MISCELLANEOUS) ×2 IMPLANT
CAP LCK SPNE (Orthopedic Implant) ×4 IMPLANT
CAP LOCK SPINE RADIUS (Orthopedic Implant) IMPLANT
CAP LOCKING (Orthopedic Implant) ×8 IMPLANT
CARTRIDGE OIL MAESTRO DRILL (MISCELLANEOUS) ×1 IMPLANT
CLSR STERI-STRIP ANTIMIC 1/2X4 (GAUZE/BANDAGES/DRESSINGS) ×1 IMPLANT
CONT SPEC 4OZ CLIKSEAL STRL BL (MISCELLANEOUS) ×2 IMPLANT
COVER BACK TABLE 60X90IN (DRAPES) ×2 IMPLANT
COVER WAND RF STERILE (DRAPES) ×2 IMPLANT
DECANTER SPIKE VIAL GLASS SM (MISCELLANEOUS) ×2 IMPLANT
DERMABOND ADVANCED (GAUZE/BANDAGES/DRESSINGS) ×1
DERMABOND ADVANCED .7 DNX12 (GAUZE/BANDAGES/DRESSINGS) ×1 IMPLANT
DEVICE INTERBODY ELEVATE 23X9 (Cage) ×2 IMPLANT
DIFFUSER DRILL AIR PNEUMATIC (MISCELLANEOUS) ×2 IMPLANT
DRAPE C-ARM 42X72 X-RAY (DRAPES) ×4 IMPLANT
DRAPE HALF SHEET 40X57 (DRAPES) ×1 IMPLANT
DRAPE LAPAROTOMY 100X72X124 (DRAPES) ×2 IMPLANT
DRAPE SURG 17X23 STRL (DRAPES) ×8 IMPLANT
DRSG OPSITE POSTOP 4X6 (GAUZE/BANDAGES/DRESSINGS) ×2 IMPLANT
DURAPREP 26ML APPLICATOR (WOUND CARE) ×2 IMPLANT
ELECT REM PT RETURN 9FT ADLT (ELECTROSURGICAL) ×2
ELECTRODE REM PT RTRN 9FT ADLT (ELECTROSURGICAL) ×1 IMPLANT
EVACUATOR 1/8 PVC DRAIN (DRAIN) ×1 IMPLANT
GAUZE 4X4 16PLY RFD (DISPOSABLE) IMPLANT
GAUZE SPONGE 4X4 12PLY STRL (GAUZE/BANDAGES/DRESSINGS) IMPLANT
GLOVE BIO SURGEON STRL SZ 6.5 (GLOVE) ×7 IMPLANT
GLOVE BIOGEL PI IND STRL 6.5 (GLOVE) ×1 IMPLANT
GLOVE BIOGEL PI IND STRL 7.0 (GLOVE) IMPLANT
GLOVE BIOGEL PI INDICATOR 6.5 (GLOVE) ×3
GLOVE BIOGEL PI INDICATOR 7.0 (GLOVE) ×2
GLOVE ECLIPSE 9.0 STRL (GLOVE) ×4 IMPLANT
GLOVE EXAM NITRILE XL STR (GLOVE) IMPLANT
GOWN STRL REUS W/ TWL LRG LVL3 (GOWN DISPOSABLE) IMPLANT
GOWN STRL REUS W/ TWL XL LVL3 (GOWN DISPOSABLE) ×2 IMPLANT
GOWN STRL REUS W/TWL 2XL LVL3 (GOWN DISPOSABLE) IMPLANT
GOWN STRL REUS W/TWL LRG LVL3 (GOWN DISPOSABLE) ×6
GOWN STRL REUS W/TWL XL LVL3 (GOWN DISPOSABLE) ×4
KIT BASIN OR (CUSTOM PROCEDURE TRAY) ×2 IMPLANT
KIT TURNOVER KIT B (KITS) ×2 IMPLANT
MILL MEDIUM DISP (BLADE) ×2 IMPLANT
NEEDLE HYPO 22GX1.5 SAFETY (NEEDLE) ×2 IMPLANT
NS IRRIG 1000ML POUR BTL (IV SOLUTION) ×2 IMPLANT
OIL CARTRIDGE MAESTRO DRILL (MISCELLANEOUS) ×2
PACK LAMINECTOMY NEURO (CUSTOM PROCEDURE TRAY) ×2 IMPLANT
ROD RADIUS 35MM (Rod) ×2 IMPLANT
SCREW 5.75X45MM (Screw) ×4 IMPLANT
SPONGE SURGIFOAM ABS GEL 100 (HEMOSTASIS) ×2 IMPLANT
STRIP CLOSURE SKIN 1/2X4 (GAUZE/BANDAGES/DRESSINGS) ×4 IMPLANT
SUT VIC AB 0 CT1 18XCR BRD8 (SUTURE) ×2 IMPLANT
SUT VIC AB 0 CT1 8-18 (SUTURE) ×2
SUT VIC AB 2-0 CT1 18 (SUTURE) ×2 IMPLANT
SUT VIC AB 3-0 SH 8-18 (SUTURE) ×4 IMPLANT
TOWEL GREEN STERILE (TOWEL DISPOSABLE) ×2 IMPLANT
TOWEL GREEN STERILE FF (TOWEL DISPOSABLE) ×2 IMPLANT
TRAY FOLEY MTR SLVR 16FR STAT (SET/KITS/TRAYS/PACK) ×2 IMPLANT
WATER STERILE IRR 1000ML POUR (IV SOLUTION) ×2 IMPLANT

## 2018-12-30 NOTE — H&P (Signed)
Misty Maxwell is an 53 y.o. female.   Chief Complaint: Back pain HPI: 53 year old female with back and bilateral lower extremity intractable pain right greater than left.  Patient is failed conservative management.  Work-up demonstrates evidence of unstable degenerative spondylolisthesis with significant stenosis at L4-5.  Patient presents now for decompression and fusion in hopes of improving her symptoms.  Past Medical History:  Diagnosis Date  . Arthritis    denies  . Diabetes mellitus without complication ([place])   . Glaucoma   . Hypertension   . Impaired glucose tolerance   . Obesity     Past Surgical History:  Procedure Laterality Date  . COLONOSCOPY N/A 01/05/2016   Procedure: COLONOSCOPY;  Surgeon: Arta Silence, MD;  Location: [facility] ENDOSCOPY;  Service: Endoscopy;  Laterality: N/A;    Family History  Problem Relation Age of Onset  . Diabetes Father   . Stroke Father   . Aneurysm Father   . Stroke Maternal Grandmother   . Diabetes Paternal Grandmother   . Hypertension Mother    Social History:  reports that she has never smoked. She has never used smokeless tobacco. She reports previous alcohol use. She reports that she does not use drugs.  Allergies: No Known Allergies  Medications Prior to Admission  Medication Sig Dispense Refill  . amLODipine (NORVASC) 5 MG tablet TAKE 1 TABLET BY MOUTH DAILY. (Patient taking differently: Take 5 mg by mouth daily. ) 90 tablet 0  . aspirin 81 MG tablet Take 1 tablet (81 mg total) by mouth daily. 30 tablet 1  . bimatoprost (LUMIGAN) 0.03 % ophthalmic drops Place 1 drop into both eyes at bedtime.     . dorzolamide-timolol (COSOPT) 22.3-6.8 MG/ML ophthalmic solution Place 1 drop into both eyes at bedtime.     Marland Kitchen ibuprofen (ADVIL) 200 MG tablet Take 200 mg by mouth every 6 (six) hours as needed for headache or moderate pain.    Marland Kitchen lisinopril-hydrochlorothiazide (ZESTORETIC) 20-25 MG tablet TAKE 1 TABLET BY MOUTH DAILY. 90 tablet 0  .  Melatonin 5 MG CAPS Take 10 mg by mouth at bedtime as needed (sleep).    . metFORMIN (GLUCOPHAGE) 500 MG tablet Take 1 tablet (500 mg total) by mouth 2 (two) times daily with a meal. 90 tablet 3  . metoprolol (TOPROL-XL) 200 MG 24 hr tablet TAKE 1 TABLET BY MOUTH DAILY. (Patient taking differently: Take 200 mg by mouth daily. ) 90 tablet 0  . DULoxetine (CYMBALTA) 60 MG capsule Take 1 capsule (60 mg total) by mouth daily. (Patient not taking: Reported on 12/16/2018) 30 capsule 2  . gabapentin (NEURONTIN) 300 MG capsule TAKE 1 CAPSULE BY MOUTH 2 TIMES DAILY. (Patient not taking: Reported on 12/16/2018) 60 capsule 1  . glucose blood (FREESTYLE TEST STRIPS) test strip Use as instructed 100 each 12  . glucose monitoring kit (FREESTYLE) monitoring kit 1 each by Does not apply route as needed for other. 1 each 1    Results for orders placed or performed during the hospital encounter of 12/30/18 (from the past 48 hour(s))  Glucose, capillary     Status: Abnormal   Collection Time: 12/30/18  6:23 AM  Result Value Ref Range   Glucose-Capillary 103 (H) 70 - 99 mg/dL   Comment 1 Notify RN    Comment 2 Document in Chart    No results found.  Pertinent items noted in HPI and remainder of comprehensive ROS otherwise negative.  Blood pressure 139/72, pulse 64, temperature 98.1 F (36.7 C),  temperature source Oral, height '5\' 8"'  (1.727 m), weight (!) 142.9 kg, last menstrual period 09/29/2010, SpO2 97 %.  Patient is awake and alert.  She is oriented and appropriate.  Speech is fluent.  Judgment insight are intact.  Cranial nerve function normal bilateral.  Motor examination of the extremities reveals mild weakness of dorsiflexion on the right side grading out of 4+/5 remaining in the motor strength intact.  Sensory examination with mild decrease sensation pinprick and light touch in her L5 and S1 dermatomes bilateral.  Deep tendon mixes are hypoactive throughout.  No evidence of long track signs.  Gait is  antalgic.  Posture is mildly flexed.  Examination of the head ears eyes nose throat is unremarked.  Examination chest and abdomen find to be morbidly obese but otherwise benign.  Extremities are free from injury or deformity. 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  Plan bilateral L4-5 decompressive laminotomies and foraminotomies followed by posterior lumbar interbody fusion utilizing interbody cages, locally harvested autograft, and augmented with posterior lateral arthrodesis utilizing nonsegmental pedicle screw fixation and local autografting.  Risks and benefits of been explained.  Patient wishes to proceed.  Mallie Mussel A Taiki Buckwalter 12/30/2018, 7:49 AM

## 2018-12-30 NOTE — Anesthesia Procedure Notes (Signed)
Procedure Name: Intubation Date/Time: 12/30/2018 8:09 AM Performed by: Harden Mo, CRNA Pre-anesthesia Checklist: Patient identified, Emergency Drugs available, Suction available and Patient being monitored Patient Re-evaluated:Patient Re-evaluated prior to induction Oxygen Delivery Method: Circle System Utilized Preoxygenation: Pre-oxygenation with 100% oxygen Induction Type: IV induction and Rapid sequence Ventilation: Mask ventilation without difficulty Laryngoscope Size: Glidescope and 4 Grade View: Grade I Tube type: Oral Tube size: 7.0 mm Number of attempts: 1 Airway Equipment and Method: Stylet and Oral airway Placement Confirmation: ETT inserted through vocal cords under direct vision,  positive ETCO2 and breath sounds checked- equal and bilateral Secured at: 22 cm Tube secured with: Tape Dental Injury: Teeth and Oropharynx as per pre-operative assessment

## 2018-12-30 NOTE — Op Note (Signed)
Date of procedure: 12/30/2018  Date of dictation: Same  Service: Neurosurgery  Preoperative diagnosis: L4-5 degenerative unstable spondylolisthesis with severe stenosis and radiculopathy  Morbid obesity  Postoperative diagnosis: Same  Procedure Name: Bilateral L4-5 decompressive laminotomies with foraminotomies, more than would be required for simple interbody fusion alone.  L4-5 ponte osteotomies  L4-5 posterior lumbar interbody fusion utilizing interbody cages and locally harvested autograft  L4-5 posterior lateral arthrodesis utilizing nonsegmental pedicle screw fixation and local autograft  Surgeon:Mordecai Tindol A.Tyera Hansley, M.D.  Asst. Surgeon: Reinaldo Meeker, NP  Anesthesia: General  Indication: 53 year old morbidly obese female with severe back and right lower extremity pain failing conservative management.  Work-up demonstrates evidence of a grade 1/grade 2 unstable degenerative spondylolisthesis with a broad-based disc protrusion and severe foraminal stenosis on the right at L4-5.  Patient presents now for lumbar decompression and fusion surgery in hopes of improving her symptoms.  Operative note: After induction anesthesia, patient position prone on the Wilson frame and appropriately padded.  Lumbar region prepped and draped sterilely.  Incision made overlying L4-5.  Dissection performed bilaterally.  Retractor placed.  Fluoroscopy used.  Levels confirmed.  Decompressive laminotomies and facetectomies were then performed using Leksell rongeurs Kerrison rongeurs and the high-speed drill to remove the inferior two thirds of the lamina of L4 the entire inferior facet and pars interarticularis of L4 bilaterally and the majority of the superior facet of L5 bilaterally.  Ligament flavum elevated and resected.  Foraminotomies completed on the course the exiting L4 and L5 nerve roots.  Ponte osteotomies completed for reduction of the patient's sagittal plane deformity.  Bilateral discectomies then performed  at L4-5.  The space then prepared for interbody fusion.  With distractors left the patient's right side the space prepared on the left side.  Soft tissue was removed the interspace.  A 9 mm Medtronic expandable cage packed with locally harvested autograft was then impacted into place and expanded to its full extent.  Distractor removed patient's right side.  To space then prepared on the right side for interbody fusion.  After the soft tissue was removed the interspace.  Morselized autograft was packed in the interspace and then a second cage packed with autograft was then impacted into place and expanded to its full extent.  Pedicles at L4 and L5 were identified using surface landmarks and intraoperative fluoroscopy.  Superficial bone around the pedicle was then removed using high-speed drill.  Each pedicle was then probed using a pedicle all each pedicle tract was probed and found to be solid within the bone.  Each pedicle all track was then tapped with a screw tap.  Each screw temple was probed and found to be solidly within the bone.  5.75 mm radius brand screws from [employer] were placed bilaterally at L4 and L5.  Final images reveal good position of the cages and the hardware at the proper operative level with normal alignment of the spine.  Wound was then irrigated one final time.  Transverse processes and residual facets were decorticated.  Morselized autograft was packed posterior laterally.  Short segment titanium rod placed through the screw heads at L4 and L5.  Locking caps placed over the screws.  Locking caps then engaged with a construct under compression.  Vancomycin powder was placed in deep wound space.  Wounds and closed in layers with Vicryl sutures.  Steri-Strips and sterile dressing were applied.  No apparent complications.  Patient tolerated the procedure well and she returns to the recovery room postop.

## 2018-12-30 NOTE — Anesthesia Postprocedure Evaluation (Signed)
Anesthesia Post Note  Patient: Misty Maxwell  Procedure(s) Performed: POSTERIOR LUMBAR INTERBODY FUSION- LUMBAR FOUR-LUMBAR FIVE (N/A Back)     Patient location during evaluation: PACU Anesthesia Type: General Level of consciousness: awake Pain management: pain level controlled Vital Signs Assessment: post-procedure vital signs reviewed and stable Respiratory status: spontaneous breathing Cardiovascular status: stable Postop Assessment: no apparent nausea or vomiting Anesthetic complications: no    Last Vitals:  Vitals:   12/30/18 1232 12/30/18 1605  BP: 127/80 137/78  Pulse: 68 84  Resp: 16 18  Temp:  36.5 C  SpO2: 93% 98%    Last Pain:  Vitals:   12/30/18 1745  TempSrc:   PainSc: 6                  Brandon Wiechman

## 2018-12-30 NOTE — Brief Op Note (Signed)
12/30/2018  10:59 AM  PATIENT:  Misty Maxwell  53 y.o. female  PRE-OPERATIVE DIAGNOSIS:  Spondlylolisthesis  POST-OPERATIVE DIAGNOSIS:  Spondlylolisthesis  PROCEDURE:  Procedure(s): POSTERIOR LUMBAR INTERBODY FUSION- LUMBAR FOUR-LUMBAR FIVE (N/A)  SURGEON:  Surgeon(s) and Role:    * Earnie Larsson, MD - Primary  PHYSICIAN ASSISTANT:   ASSISTANTSMearl Latin   ANESTHESIA:   general  EBL:  250 mL   BLOOD ADMINISTERED:none  DRAINS: none   LOCAL MEDICATIONS USED:  MARCAINE     SPECIMEN:  No Specimen  DISPOSITION OF SPECIMEN:  N/A  COUNTS:  YES  TOURNIQUET:  * No tourniquets in log *  DICTATION: .Dragon Dictation  PLAN OF CARE: Admit to inpatient   PATIENT DISPOSITION:  PACU - hemodynamically stable.   Delay start of Pharmacological VTE agent (>24hrs) due to surgical blood loss or risk of bleeding: yes

## 2018-12-30 NOTE — Transfer of Care (Signed)
Immediate Anesthesia Transfer of Care Note  Patient: Misty Maxwell  Procedure(s) Performed: POSTERIOR LUMBAR INTERBODY FUSION- LUMBAR FOUR-LUMBAR FIVE (N/A Back)  Patient Location: PACU  Anesthesia Type:General  Level of Consciousness: awake, alert  and oriented  Airway & Oxygen Therapy: Patient Spontanous Breathing and Patient connected to nasal cannula oxygen  Post-op Assessment: Report given to RN, Post -op Vital signs reviewed and stable and Patient moving all extremities X 4  Post vital signs: Reviewed and stable  Last Vitals:  Vitals Value Taken Time  BP 126/84 12/30/18 1114  Temp    Pulse 79 12/30/18 1115  Resp 13 12/30/18 1115  SpO2 99 % 12/30/18 1115  Vitals shown include unvalidated device data.  Last Pain:  Vitals:   12/30/18 0657  TempSrc:   PainSc: 5       Patients Stated Pain Goal: 6 ([id number])  Complications: No apparent anesthesia complications

## 2018-12-30 NOTE — Evaluation (Signed)
Physical Therapy Evaluation Patient Details Name: Misty Maxwell MRN: [id number] DOB: 1965-05-09 Today's Date: 12/30/2018   History of Present Illness  Pt is 53 yo female with morbid obesity, HTN, DM, OA, and lumbar stenosis that has failed conservative mgmt. Pt underwent PLIF L4-5 on 12/30/18.   Clinical Impression  Patient evaluated by Physical Therapy with no further acute PT needs identified. All education has been completed and the patient has no further questions. Pt mobilizing well with 5/10 back pain while ambulating, 8/10 in sitting. Ambulated 300' with supervision with stable stance. Reviewed back precautions and proper posture.  See below for any follow-up Physical Therapy or equipment needs. PT is signing off. Thank you for this referral.     Follow Up Recommendations No PT follow up    Equipment Recommendations  None recommended by PT    Recommendations for Other Services       Precautions / Restrictions Precautions Precautions: Back Precaution Booklet Issued: Yes (comment) Precaution Comments: reviewed back precautions and handout Required Braces or Orthoses: Spinal Brace Spinal Brace: Lumbar corset;Applied in sitting position Restrictions Weight Bearing Restrictions: No      Mobility  Bed Mobility Overal bed mobility: Needs Assistance Bed Mobility: Rolling;Sidelying to Sit;Sit to Sidelying Rolling: Supervision Sidelying to sit: Supervision     Sit to sidelying: Supervision General bed mobility comments: vc's for educating pt on keeping precautions with bed mobility. Pt voiced understanding and was able to keep effectively  Transfers Overall transfer level: Modified independent Equipment used: None             General transfer comment: vc's for controlling descent  Ambulation/Gait Ambulation/Gait assistance: Supervision Gait Distance (Feet): 300 Feet Assistive device: None Gait Pattern/deviations: Step-through pattern;Antalgic;Wide base of  support Gait velocity: WFL Gait velocity interpretation: >4.37 ft/sec, indicative of normal walking speed General Gait Details: antalgia noted and pt occasionally used hall rail but has RW at home if she needs it for pain control  Stairs Stairs: Yes Stairs assistance: Supervision Stair Management: One rail Right;Step to pattern;Forwards Number of Stairs: 1 General stair comments: pt with sufficient strength for step and no increased pain  Wheelchair Mobility    Modified Rankin (Stroke Patients Only)       Balance Overall balance assessment: Modified Independent                                           Pertinent Vitals/Pain Pain Assessment: 0-10 Pain Score: 5  Pain Location: back Pain Descriptors / Indicators: Aching;Grimacing;Operative site guarding;Sore Pain Intervention(s): Limited activity within patient's tolerance;Monitored during session    [place] expects to be discharged to:: Private residence Living Arrangements: Spouse/significant other Available Help at Discharge: Family;Available 24 hours/day Type of Home: House Home Access: Stairs to enter Entrance Stairs-Rails: None Entrance Stairs-Number of Steps: 1 Home Layout: One level Home Equipment: Walker - 2 wheels Additional Comments: pt's husband has some disabilities, cannot physically assist her but can supervise. Her brother in law will help as well    Prior Function Level of Independence: Independent         Comments: pt works in [employer]        Extremity/Trunk Assessment   Upper Extremity Assessment Upper Extremity Assessment: Overall WFL for tasks assessed    Lower Extremity Assessment Lower Extremity Assessment: Overall WFL for tasks assessed  Cervical / Trunk Assessment Cervical / Trunk Assessment: Other exceptions Cervical / Trunk Exceptions: morbidly obese  Communication   Communication: No difficulties   Cognition Arousal/Alertness: Awake/alert Behavior During Therapy: WFL for tasks assessed/performed Overall Cognitive Status: Within Functional Limits for tasks assessed                                        General Comments General comments (skin integrity, edema, etc.): reviewed proper posture and activity upon return home    Exercises     Assessment/Plan    PT Assessment Patent does not need any further PT services  PT Problem List         PT Treatment Interventions      PT Goals (Current goals can be found in the Care Plan section)  Acute Rehab PT Goals Patient Stated Goal: return home PT Goal Formulation: All assessment and education complete, DC therapy    Frequency     Barriers to discharge        Co-evaluation               AM-PAC PT "6 Clicks" Mobility  Outcome Measure Help needed turning from your back to your side while in a flat bed without using bedrails?: None Help needed moving from lying on your back to sitting on the side of a flat bed without using bedrails?: None Help needed moving to and from a bed to a chair (including a wheelchair)?: A Little Help needed standing up from a chair using your arms (e.g., wheelchair or bedside chair)?: A Little Help needed to walk in hospital room?: A Little Help needed climbing 3-5 steps with a railing? : A Little 6 Click Score: 20    End of Session Equipment Utilized During Treatment: Back brace Activity Tolerance: Patient tolerated treatment well Patient left: in chair;with call bell/phone within reach Nurse Communication: Mobility status PT Visit Diagnosis: Other abnormalities of gait and mobility (R26.89);Pain Pain - part of body: (back)    Time: [id number] PT Time Calculation (min) (ACUTE ONLY): 27 min   Charges:   PT Evaluation $PT Eval Low Complexity: 1 Low PT Treatments $Gait Training: 8-22 mins        [place]  Pager  [phone] Office [place] 12/30/2018, 4:46 PM

## 2018-12-31 LAB — GLUCOSE, CAPILLARY: Glucose-Capillary: 141 mg/dL — ABNORMAL HIGH (ref 70–99)

## 2018-12-31 MED ORDER — OXYCODONE HCL 10 MG PO TABS: 10.0000 mg | ORAL_TABLET | ORAL | 0 refills | Status: DC | PRN

## 2018-12-31 MED ORDER — DIAZEPAM 5 MG PO TABS: 5.0000 mg | ORAL_TABLET | Freq: Four times a day (QID) | ORAL | 0 refills | Status: DC | PRN

## 2018-12-31 MED FILL — oxyCODONE HCL 10 MG TABS: 10 | 6 days supply | Qty: 40 | Fill #0

## 2018-12-31 MED FILL — diazePAM 5 MG TABS: 5 | 4 days supply | Qty: 30 | Fill #0

## 2018-12-31 MED FILL — Heparin Sodium (Porcine) Inj 1000 Unit/ML: INTRAMUSCULAR | Qty: 30 | Status: AC

## 2018-12-31 MED FILL — Sodium Chloride IV Soln 0.9%: INTRAVENOUS | Qty: 1000 | Status: AC

## 2018-12-31 NOTE — Evaluation (Signed)
Occupational Therapy Evaluation Patient Details Name: Misty Maxwell MRN: [id number] DOB: 05/30/1965 Today's Date: 12/31/2018    History of Present Illness Pt is 53 yo female with morbid obesity, HTN, DM, OA, and lumbar stenosis that has failed conservative mgmt. Pt underwent PLIF L4-5 on 12/30/18.    Clinical Impression   Pt PTA: Pt was independent with ADL and mobility. Pt currently plans to use reacher for LB needs. Pt wears slip on shoes; UB ADL donning/doffing with independence. Pt modified independence with LB ADL with reacher and AE provided. Pt did not require additional skilled services at this time. Back handout provided and reviewed ADL in detail. Pt educated on: clothing between brace, never sleep in brace, set an alarm at night for medication, avoid sitting for long periods of time, correct bed positioning for sleeping, correct sequence for bed mobility, avoiding lifting more than 5 pounds and never wash directly over incision. All education is complete and patient indicates understanding. Pt does not require continued OT skilled services. OT signing off       Follow Up Recommendations  No OT follow up    Equipment Recommendations  None recommended by OT    Recommendations for Other Services       Precautions / Restrictions Precautions Precautions: Back Precaution Booklet Issued: Yes (comment) Precaution Comments: reviewed back precautions and handout Required Braces or Orthoses: Spinal Brace Spinal Brace: Lumbar corset;Applied in sitting position Restrictions Weight Bearing Restrictions: No      Mobility Bed Mobility Overal bed mobility: Needs Assistance Bed Mobility: Rolling;Sidelying to Sit;Sit to Sidelying Rolling: Supervision Sidelying to sit: Supervision     Sit to sidelying: Supervision General bed mobility comments: verbal cues for proper technique  Transfers Overall transfer level: Modified independent Equipment used: None                   Balance Overall balance assessment: Modified Independent                                         ADL either performed or assessed with clinical judgement   ADL Overall ADL's : At baseline                                       General ADL Comments: Pt plans to use reacher for LB needs. Pt wears slip on shoes; UB ADL donning/doffing with independence. Pt modified independence with LB ADL with reacher and AE provided.     Vision Baseline Vision/History: No visual deficits Vision Assessment?: No apparent visual deficits     Perception     Praxis      Pertinent Vitals/Pain Pain Assessment: 0-10 Pain Score: 2  Pain Location: back Pain Descriptors / Indicators: Aching;Grimacing;Operative site guarding;Sore Pain Intervention(s): Monitored during session     Hand Dominance     Extremity/Trunk Assessment Upper Extremity Assessment Upper Extremity Assessment: Overall WFL for tasks assessed   Lower Extremity Assessment Lower Extremity Assessment: Overall WFL for tasks assessed   Cervical / Trunk Assessment Cervical / Trunk Assessment: Other exceptions Cervical / Trunk Exceptions: large body habitus   Communication Communication Communication: No difficulties   Cognition Arousal/Alertness: Awake/alert Behavior During Therapy: WFL for tasks assessed/performed Overall Cognitive Status: Within Functional Limits for tasks assessed  General Comments       Exercises     Shoulder Instructions      Home Living Family/patient expects to be discharged to:: Private residence Living Arrangements: Spouse/significant other Available Help at Discharge: Family;Available 24 hours/day Type of Home: House Home Access: Stairs to enter [employer] of Steps: 1 Entrance Stairs-Rails: None Home Layout: One level     Bathroom Shower/Tub: Teacher, early years/pre: Handicapped  height     Home Equipment: Environmental consultant - 2 wheels;Bedside commode;Tub bench;Hand held shower head;Adaptive equipment Adaptive Equipment: Reacher;Sock aid Additional Comments: pt's husband h/o CVA cannot assist physically, but can provide supervisionA; pt's BIl can assist physical assist.      Prior Functioning/Environment Level of Independence: Independent        Comments: pt works in [employer]        OT Problem List: Decreased activity tolerance      OT Treatment/Interventions:      OT Goals(Current goals can be found in the care plan section) Acute Rehab OT Goals Patient Stated Goal: return home OT Goal Formulation: With patient Time For Goal Achievement: 01/14/19 Potential to Achieve Goals: Good  OT Frequency:     Barriers to D/C:            Co-evaluation              AM-PAC OT "6 Clicks" Daily Activity     Outcome Measure Help from another person eating meals?: None Help from another person taking care of personal grooming?: None Help from another person toileting, which includes using toliet, bedpan, or urinal?: None Help from another person bathing (including washing, rinsing, drying)?: A Little Help from another person to put on and taking off regular upper body clothing?: None Help from another person to put on and taking off regular lower body clothing?: None 6 Click Score: 23   End of Session Equipment Utilized During Treatment: Back brace Nurse Communication: Mobility status  Activity Tolerance: Patient tolerated treatment well Patient left: in bed;with call bell/phone within reach  OT Visit Diagnosis: Unsteadiness on feet (R26.81);Muscle weakness (generalized) (M62.81)                Time: [id number] OT Time Calculation (min): 29 min Charges:  OT General Charges $OT Visit: 1 Visit OT Evaluation $OT Eval Moderate Complexity: 1 Mod OT Treatments $Self Care/Home Management : 8-22 mins  Ebony Hail Harold Hedge) Marsa Aris OTR/L Acute  Rehabilitation Services Pager: [phone] Office: [place] 12/31/2018, 8:53 AM

## 2018-12-31 NOTE — Discharge Instructions (Signed)

## 2018-12-31 NOTE — Plan of Care (Signed)
Pt doing well. Pt given D/C instructions with verbal understanding. Rx's were sent to pharmacy by MD. Pt's incision is clean and dry with no sign of infection. Pt's IV was removed prior to D/C. Pt D/C'd home via wheelchair per MD order. Pt is stable @ D/C and has no other needs at this time. Bellamie Turney, RN  

## 2018-12-31 NOTE — Discharge Summary (Signed)
Physician Discharge Summary  Patient ID: Misty Maxwell MRN: [id number] DOB/AGE: 04/02/1965 53 y.o.  Admit date: 12/30/2018 Discharge date: 12/31/2018  Admission Diagnoses:  Discharge Diagnoses:  Active Problems:   Degenerative spondylolisthesis   Discharged Condition: good  Hospital Course: Patient admitted to the hospital where she underwent uncomplicated lumbar decompression and fusion surgery.  Postoperatively doing well.  Preoperative back and right lower extremity pain much improved.  Standing walking and voiding without difficulty.  Patient feels ready for discharge home.  Consults:   Significant Diagnostic Studies:   Treatments:   Discharge Exam: Blood pressure (!) 105/53, pulse 91, temperature 98.1 F (36.7 C), temperature source Oral, resp. rate 20, height '5\' 8"'  (1.727 m), weight (!) 142.9 kg, last menstrual period 09/29/2010, SpO2 98 %. Awake and alert.  Oriented and appropriate.  Motor and sensory function intact.  Wound clean and dry.  Chest and abdomen benign.  Disposition: Discharge disposition: 01-Home or Self Care        Allergies as of 12/31/2018   No Known Allergies     Medication List    TAKE these medications   amLODipine 5 MG tablet Commonly known as: NORVASC TAKE 1 TABLET BY MOUTH DAILY.   aspirin 81 MG tablet Take 1 tablet (81 mg total) by mouth daily.   bimatoprost 0.03 % ophthalmic solution Commonly known as: LUMIGAN Place 1 drop into both eyes at bedtime.   diazepam 5 MG tablet Commonly known as: VALIUM Take 1-2 tablets (5-10 mg total) by mouth every 6 (six) hours as needed for muscle spasms.   dorzolamide-timolol 22.3-6.8 MG/ML ophthalmic solution Commonly known as: COSOPT Place 1 drop into both eyes at bedtime.   DULoxetine 60 MG capsule Commonly known as: CYMBALTA Take 1 capsule (60 mg total) by mouth daily.   FREESTYLE TEST STRIPS test strip Generic drug: glucose blood Use as instructed   gabapentin 300 MG  capsule Commonly known as: NEURONTIN TAKE 1 CAPSULE BY MOUTH 2 TIMES DAILY.   glucose monitoring kit monitoring kit 1 each by Does not apply route as needed for other.   ibuprofen 200 MG tablet Commonly known as: ADVIL Take 200 mg by mouth every 6 (six) hours as needed for headache or moderate pain.   lisinopril-hydrochlorothiazide 20-25 MG tablet Commonly known as: ZESTORETIC TAKE 1 TABLET BY MOUTH DAILY.   Melatonin 5 MG Caps Take 10 mg by mouth at bedtime as needed (sleep).   metFORMIN 500 MG tablet Commonly known as: Glucophage Take 1 tablet (500 mg total) by mouth 2 (two) times daily with a meal.   metoprolol 200 MG 24 hr tablet Commonly known as: TOPROL-XL TAKE 1 TABLET BY MOUTH DAILY.   Oxycodone HCl 10 MG Tabs Take 1 tablet (10 mg total) by mouth every 3 (three) hours as needed for severe pain ((score 7 to 10)).            Durable Medical Equipment  (From admission, onward)         Start     Ordered   12/30/18 1229  DME Walker rolling  Once    Question:  Patient needs a walker to treat with the following condition  Answer:  Degenerative spondylolisthesis   12/30/18 1228   12/30/18 1229  DME 3 n 1  Once     12/30/18 1228           Signed: Mallie Mussel A Patrcia Schnepp 12/31/2018, 9:34 AM

## 2019-01-02 ENCOUNTER — Other Ambulatory Visit: Payer: Self-pay | Admitting: *Deleted

## 2019-01-02 ENCOUNTER — Encounter: Payer: Self-pay | Admitting: *Deleted

## 2019-01-02 MED FILL — CELECOXIB 100 MG CAP: 100 | 30 days supply | Qty: 60 | Fill #0

## 2019-01-02 NOTE — Patient Outreach (Signed)
[facility]) Care Management  01/02/2019  BETHANNE LUNDSTEDT 01/09/1966 [id number]   Transition of care call/case closure   Referral received:12/24/18 Initial outreach:01/02/19 Insurance: [facility] plan    Subjective: Initial successful telephone call to patient's preferred number in order to complete transition of care assessment; 2 HIPAA identifiers verified. Explained purpose of call and completed transition of care assessment.  Paulena states feeling a little better on this morning, she discussed not resting well on last night due to  not being able to get comfortable due to nerve discomfort in her right foot as before surgery she reports taking prn prescribed medication little relief until taking ibuprofen early morning. She discussed planning to call Dr. Annette Stable office to notify of discomfort encouraged her to follow through for further recommendations.   She reports incision still having dressing in place and understands dressing can be removed after 2 day and steristrips to remain in place. She reports tolerating mobility in the home without need of walker, up walking at least every hours and wearing back brace when up, she continues to do sponge baths at this time. She discussed having grab bar in bathroom for assistance . She denies nausea tolerating diet well. She denies bladder or bowel problems.  Voni states that her husband has history of stroke and her brother in law comes daily to assist with meals and as needed in the home, neighbor also able to assist with transportation as needed.    Christeena discussed ongoing chronic health conditions of hypertension and diabetes, she reports being followed in chronic disease management with , Active health management coach upcoming  telephone meeting in the next week.  She reports her blood pressure reading at home has been 127/69, she usually monitors blood sugar each morning but has not checked since being at home,  reinforced continued monitoring daily .   She says she does have the hospital indemnity and has filed a claim on yesterday and well she has followed up on FMLA leave paperwork  being completed.  She uses  [facility] outpatient pharmacy . She denies educational needs related to staying safe during the COVID 19 pandemic.    Objective:  Ashlei Saragosa  was hospitalized at [facility]  from 12/1-12/2/20  For PLIF L4-L5, ( Posterior lumbar interbody fusion )  Comorbidities include: Diabetes A1c 6.3 on 12/22/18, degenerative spondylolisthesis , mild stage glaucoma.  She  was discharged to home on 12/31/18 without the need for home health services or DME.   Assessment:  Patient voices good understanding of all discharge instructions.  See transition of care flowsheet for assessment details.   Plan:  Reviewed hospital discharge diagnosis of  PLIF L 4- L5  and discharge treatment plan using hospital discharge instructions, assessing medication adherence, reviewing problems requiring provider notification, and discussing the importance of follow up with surgeon specialists as directed. Reviewed [facility] announcements that all [facility] members will receive the Healthy Lifestyle Premium rate in 2021.  Using [facility] website, verified that patient is an active participate in [facility]'s Active Health Management chronic disease management program.    No ongoing care management needs identified so will close case to [place] [facility] services and route successful outreach letter with [place] Management pamphlet and 24 Hour Nurse Line Magnet to [facility] clinical pool to be mailed to patient's home address. Thanked patient for their services to [facility].  Joylene Draft, RN, [place] Management Coordinator  [phone]- Mobile [phone]- Toll Free Main  Office

## 2019-02-03 DIAGNOSIS — H401131 Primary open-angle glaucoma, bilateral, mild stage: Secondary | ICD-10-CM | POA: Diagnosis not present

## 2019-02-05 DIAGNOSIS — M4310 Spondylolisthesis, site unspecified: Secondary | ICD-10-CM | POA: Diagnosis not present

## 2019-03-03 ENCOUNTER — Other Ambulatory Visit: Payer: Self-pay | Admitting: Family Medicine

## 2019-03-03 DIAGNOSIS — I10 Essential (primary) hypertension: Secondary | ICD-10-CM

## 2019-03-03 MED FILL — LISINOPRIL-HCTZ 20-25 MG TA: 20-25 | 90 days supply | Qty: 90 | Fill #0

## 2019-03-03 MED FILL — metFORMIN HCL 500 MG TABS: 500 | 90 days supply | Qty: 180 | Fill #0

## 2019-03-03 MED FILL — AMLODIPINE BESYLATE 5 MG TA: 5 | 90 days supply | Qty: 90 | Fill #0

## 2019-03-03 MED FILL — METOPROLOL SUCCINATE ER 200: 200 | 90 days supply | Qty: 90 | Fill #0

## 2019-03-11 DIAGNOSIS — M4310 Spondylolisthesis, site unspecified: Secondary | ICD-10-CM | POA: Diagnosis not present

## 2019-04-22 DIAGNOSIS — M4310 Spondylolisthesis, site unspecified: Secondary | ICD-10-CM | POA: Diagnosis not present

## 2019-04-22 DIAGNOSIS — Z6841 Body Mass Index (BMI) 40.0 and over, adult: Secondary | ICD-10-CM | POA: Diagnosis not present

## 2019-05-05 DIAGNOSIS — H401131 Primary open-angle glaucoma, bilateral, mild stage: Secondary | ICD-10-CM | POA: Diagnosis not present

## 2019-05-07 ENCOUNTER — Ambulatory Visit (INDEPENDENT_AMBULATORY_CARE_PROVIDER_SITE_OTHER): Payer: 59 | Admitting: Family Medicine

## 2019-05-07 ENCOUNTER — Encounter: Payer: Self-pay | Admitting: Family Medicine

## 2019-05-07 ENCOUNTER — Other Ambulatory Visit: Payer: Self-pay

## 2019-05-07 VITALS — BP 118/86 | HR 69 | Ht 68.0 in | Wt 316.6 lb

## 2019-05-07 DIAGNOSIS — E119 Type 2 diabetes mellitus without complications: Secondary | ICD-10-CM | POA: Diagnosis not present

## 2019-05-07 DIAGNOSIS — I10 Essential (primary) hypertension: Secondary | ICD-10-CM | POA: Diagnosis not present

## 2019-05-07 LAB — POCT GLYCOSYLATED HEMOGLOBIN (HGB A1C): HbA1c, POC (controlled diabetic range): 6.2 % (ref 0.0–7.0)

## 2019-05-07 LAB — POCT UA - MICROALBUMIN
Albumin/Creatinine Ratio, Urine, POC: <30
Creatinine, POC: 100 mg/dL
Microalbumin Ur, POC: 10 mg/L

## 2019-05-07 NOTE — Assessment & Plan Note (Signed)
Gave her Dr. Jenne Campus' card to call for dietary guidance.

## 2019-05-07 NOTE — Patient Instructions (Addendum)
It was nice seeing you today Ms. Jue!  Your diabetes is continuing to be under great control.  You can continue taking Metformin once per day.  We can follow-up in 6 months for this.  Your hypertension is also really well controlled.  We can stop amlodipine for now, but I would like for you to make a nurse appointment in 1 to 2 weeks to get your blood pressure checked.  If it is high at that time, we will likely add it back to your regimen.  We are giving you Dr. Jenne Campus card.  Please call her when you would like to schedule an appointment to discuss your nutrition.  We are checking your cholesterol and the amount of protein in your urine today.  We will let you know what these results are once they return.  If you have any questions or concerns, please feel free to call the clinic.   Be well,  Dr. Shan Levans

## 2019-05-07 NOTE — Assessment & Plan Note (Addendum)
A1c today 6.2. Lowering her metformin dose to 500mg  per day. Checking lipid panel and albumin-creatinine ratio. Starting her on atorvastatin as her LDL has been chronically elevated.

## 2019-05-07 NOTE — Assessment & Plan Note (Signed)
Stop the amlodipine today. Instructed her to monitor her blood pressures at home and follow-up next week to have her BP checked in office to see how she is doing off the amlodipine.

## 2019-05-07 NOTE — Progress Notes (Addendum)
    SUBJECTIVE:   CHIEF COMPLAINT / HPI:   Diabetes Management She feels that her diabetes has been very well controlled and has no concerns at this time. She has only been taking 1 dose of metformin 500mg  per day and would like to officially decrease her prescription to 1 dose a day. Her blood sugar has stayed in the 99-113 in the last 2 months. She has had two episodes of hypoglycemia at work with symptoms of diaphoresis and nausea which resolved in 20 minutes after eating a snack. She is frustrated with her weight as she is eating a healthy and balanced diet but unable to lower her weight. Additionally she drinks only water, no juice. She would like to get some help from a dietitian.  Degenerative spondylolisthesis  She is doing much better after her surgery in December of 2020. She states that her right leg still feels a bit numb and the foot feels heavy like a brick, but she is feeling much better and happy with her improvement and how the surgery went. Her pain has been managed well on her prescribed medications.  Hypertension Her blood pressure has been well controlled at home. She would like to stop her amlodipine.  PERTINENT  PMH / PSH: HTN, T2DM, Degenerative spondylolisthesis, obesity  OBJECTIVE:   BP 118/86   Pulse 69   Ht 5\' 8"  (1.727 m)   Wt (!) 316 lb 9.6 oz (143.6 kg)   LMP 09/29/2010   SpO2 98%   BMI 48.14 kg/m   Physical Exam  Constitutional: She is well-developed, well-nourished, and in no distress. No distress.  Cardiovascular: Normal rate, regular rhythm, normal heart sounds and intact distal pulses.  Pulmonary/Chest: Effort normal and breath sounds normal.  Neurological:  Diabetic foot exam: Monofilament foot exam reveals normal sensation in both feet  Skin: She is not diaphoretic.   Diabetic Foot Exam - Simple   Simple Foot Form Diabetic Foot exam was performed with the following findings: Yes 05/07/2019  3:00 PM  Visual Inspection No deformities, no  ulcerations, no other skin breakdown bilaterally: Yes Sensation Testing Intact to touch and monofilament testing bilaterally: Yes Pulse Check Posterior Tibialis and Dorsalis pulse intact bilaterally: Yes Comments    ASSESSMENT/PLAN:   HYPERTENSION, BENIGN SYSTEMIC Stop the amlodipine today. Instructed her to monitor her blood pressures at home and follow-up next week to have her BP checked in office to see how she is doing off the amlodipine.  Type 2 diabetes mellitus without complication, without long-term current use of insulin (HCC) A1c today 6.2. Lowering her metformin dose to 500mg  per day. Checking lipid panel and albumin-creatinine ratio. Starting her on atorvastatin as her LDL has been chronically elevated.  OBESITY, MORBID Gave her Dr. Jenne Campus' card to call for dietary guidance.    [place] I have separately seen and examined the patient. I have discussed the findings and exam with the medical student and agree with the above note. I helped develop the management plan that is described in the student's note, and I agree with the content.  Amanda C. Shan Levans, MD PGY-3, [facility] 05/08/2019 10:19 AM

## 2019-05-08 ENCOUNTER — Other Ambulatory Visit: Payer: Self-pay | Admitting: Family Medicine

## 2019-05-08 LAB — LIPID PANEL
Chol/HDL Ratio: 3.3 ratio (ref 0.0–4.4)
Cholesterol, Total: 232 mg/dL — ABNORMAL HIGH (ref 100–199)
HDL: 71 mg/dL (ref >39)
LDL Chol Calc (NIH): 148 mg/dL — ABNORMAL HIGH (ref 0–99)
Triglycerides: 78 mg/dL (ref 0–149)
VLDL Cholesterol Cal: 13 mg/dL (ref 5–40)

## 2019-05-08 MED ORDER — ROSUVASTATIN CALCIUM 20 MG PO TABS: 20.0000 mg | ORAL_TABLET | Freq: Every day | ORAL | 3 refills | Status: DC

## 2019-05-08 MED FILL — ROSUVASTATIN CALCIUM 20 MG: 20 | 90 days supply | Qty: 90 | Fill #0

## 2019-05-21 ENCOUNTER — Ambulatory Visit (INDEPENDENT_AMBULATORY_CARE_PROVIDER_SITE_OTHER): Payer: 59 | Admitting: *Deleted

## 2019-05-21 ENCOUNTER — Other Ambulatory Visit: Payer: Self-pay

## 2019-05-21 DIAGNOSIS — I10 Essential (primary) hypertension: Secondary | ICD-10-CM

## 2019-05-21 DIAGNOSIS — Z981 Arthrodesis status: Secondary | ICD-10-CM | POA: Diagnosis not present

## 2019-05-21 DIAGNOSIS — M545 Low back pain: Secondary | ICD-10-CM | POA: Diagnosis not present

## 2019-05-21 MED FILL — MELOXICAM 15 MG TABLET: 15 | 30 days supply | Qty: 30 | Fill #0

## 2019-05-21 MED FILL — CYCLOBENZAPRINE HCL 10 MG T: 10 | 10 days supply | Qty: 30 | Fill #0

## 2019-05-21 NOTE — Progress Notes (Signed)
Patient here today for BP check, after stopping her amlodipine at her last visit.  Her last dose of amlodipine was 05/07/2019.  When checking her BP at home she reports BP's of 131/77.  BP today is 127/72 with a pulse of 64.    Checked BP in left arm with large cuff.    Symptoms present: none.   Routed note to PCP.         Christen Bame, CMA

## 2019-06-01 ENCOUNTER — Other Ambulatory Visit: Payer: Self-pay | Admitting: Family Medicine

## 2019-06-01 DIAGNOSIS — I10 Essential (primary) hypertension: Secondary | ICD-10-CM

## 2019-06-01 MED FILL — METOPROLOL SUCCINATE ER 200: 200 | 90 days supply | Qty: 90 | Fill #0

## 2019-06-01 MED FILL — LISINOPRIL-HCTZ 20-25 MG TA: 20-25 | 90 days supply | Qty: 90 | Fill #0

## 2019-06-23 MED FILL — MELOXICAM 15 MG TABLET: 15 | 30 days supply | Qty: 30 | Fill #1

## 2019-06-26 ENCOUNTER — Other Ambulatory Visit: Payer: Self-pay | Admitting: Family Medicine

## 2019-06-26 DIAGNOSIS — I10 Essential (primary) hypertension: Secondary | ICD-10-CM

## 2019-06-26 MED ORDER — LISINOPRIL-HYDROCHLOROTHIAZIDE 20-25 MG PO TABS: 1.0000 | ORAL_TABLET | Freq: Every day | ORAL | 3 refills | Status: DC

## 2019-06-26 MED ORDER — METOPROLOL SUCCINATE ER 200 MG PO TB24: 200.0000 mg | ORAL_TABLET | Freq: Every day | ORAL | 3 refills | Status: DC

## 2019-06-26 MED ORDER — AMLODIPINE BESYLATE 5 MG PO TABS: 5.0000 mg | ORAL_TABLET | Freq: Every day | ORAL | 3 refills | Status: DC

## 2019-06-26 MED FILL — AMLODIPINE BESYLATE 5 MG TA: 5 | 90 days supply | Qty: 90 | Fill #0

## 2019-08-06 MED FILL — ROSUVASTATIN CALCIUM 20 MG: 20 | 90 days supply | Qty: 90 | Fill #1

## 2019-08-11 DIAGNOSIS — H401131 Primary open-angle glaucoma, bilateral, mild stage: Secondary | ICD-10-CM | POA: Diagnosis not present

## 2019-08-25 ENCOUNTER — Other Ambulatory Visit: Payer: Self-pay | Admitting: Family Medicine

## 2019-08-25 ENCOUNTER — Other Ambulatory Visit: Payer: Self-pay | Admitting: Hematology and Oncology

## 2019-08-25 DIAGNOSIS — Z1231 Encounter for screening mammogram for malignant neoplasm of breast: Secondary | ICD-10-CM

## 2019-08-31 MED FILL — METOPROLOL SUCCINATE ER 200: 200 | 90 days supply | Qty: 90 | Fill #0

## 2019-08-31 MED FILL — METFORMIN HCL 500 MG TABS: 500 | 90 days supply | Qty: 180 | Fill #1

## 2019-08-31 MED FILL — LISINOPRIL-HCTZ 20-25 MG TA: 20-25 | 90 days supply | Qty: 90 | Fill #0

## 2019-09-03 ENCOUNTER — Other Ambulatory Visit: Payer: Self-pay

## 2019-09-03 ENCOUNTER — Ambulatory Visit
Admission: RE | Admit: 2019-09-03 | Discharge: 2019-09-03 | Disposition: A | Discharge status: Home / Self Care | Payer: 59 | Source: Ambulatory Visit | Attending: Family Medicine | Admitting: Family Medicine

## 2019-09-03 DIAGNOSIS — Z1231 Encounter for screening mammogram for malignant neoplasm of breast: Secondary | ICD-10-CM | POA: Diagnosis not present

## 2019-09-04 ENCOUNTER — Other Ambulatory Visit: Payer: Self-pay

## 2019-09-04 ENCOUNTER — Encounter: Payer: Self-pay | Admitting: Family Medicine

## 2019-09-04 ENCOUNTER — Ambulatory Visit (INDEPENDENT_AMBULATORY_CARE_PROVIDER_SITE_OTHER): Payer: 59 | Admitting: Family Medicine

## 2019-09-04 VITALS — BP 138/72 | HR 77 | Wt 310.4 lb

## 2019-09-04 DIAGNOSIS — I10 Essential (primary) hypertension: Secondary | ICD-10-CM

## 2019-09-04 DIAGNOSIS — Z1159 Encounter for screening for other viral diseases: Secondary | ICD-10-CM | POA: Diagnosis not present

## 2019-09-04 DIAGNOSIS — E1169 Type 2 diabetes mellitus with other specified complication: Secondary | ICD-10-CM

## 2019-09-04 DIAGNOSIS — E119 Type 2 diabetes mellitus without complications: Secondary | ICD-10-CM

## 2019-09-04 DIAGNOSIS — E785 Hyperlipidemia, unspecified: Secondary | ICD-10-CM | POA: Diagnosis not present

## 2019-09-04 LAB — POCT GLYCOSYLATED HEMOGLOBIN (HGB A1C): HbA1c, POC (prediabetic range): 5.9 % (ref 5.7–6.4)

## 2019-09-04 MED ORDER — METFORMIN HCL 500 MG PO TABS: 500.0000 mg | ORAL_TABLET | Freq: Every day | ORAL | 3 refills | Status: DC

## 2019-09-04 NOTE — Progress Notes (Signed)
221

## 2019-09-04 NOTE — Patient Instructions (Signed)
Ms. Smick,   Congratulations! 5.9 and down 6 pounds!!! You are doing so well! Keep up the great work!  Check back in three months. If you need anything in the mean time, just MyChart or give [place] a call.   So great to see you here!   -Dr. Maudie Mercury

## 2019-09-05 DIAGNOSIS — E1169 Type 2 diabetes mellitus with other specified complication: Secondary | ICD-10-CM | POA: Insufficient documentation

## 2019-09-05 LAB — BASIC METABOLIC PANEL
BUN/Creatinine Ratio: 25 — ABNORMAL HIGH (ref 9–23)
BUN: 19 mg/dL (ref 6–24)
CO2: 25 mmol/L (ref 20–29)
Calcium: 10.2 mg/dL (ref 8.7–10.2)
Chloride: 101 mmol/L (ref 96–106)
Creatinine, Ser: 0.77 mg/dL (ref 0.57–1.00)
GFR calc Af Amer: 101 mL/min/{1.73_m2} (ref >59)
GFR calc non Af Amer: 88 mL/min/{1.73_m2} (ref >59)
Glucose: 95 mg/dL (ref 65–99)
Potassium: 4.1 mmol/L (ref 3.5–5.2)
Sodium: 140 mmol/L (ref 134–144)

## 2019-09-05 LAB — LIPID PANEL
Chol/HDL Ratio: 2.3 ratio (ref 0.0–4.4)
Cholesterol, Total: 154 mg/dL (ref 100–199)
HDL: 67 mg/dL (ref >39)
LDL Chol Calc (NIH): 70 mg/dL (ref 0–99)
Triglycerides: 93 mg/dL (ref 0–149)
VLDL Cholesterol Cal: 17 mg/dL (ref 5–40)

## 2019-09-05 LAB — HEPATITIS C ANTIBODY: Hep C Virus Ab: <0.1 s/co ratio (ref 0.0–0.9)

## 2019-09-05 NOTE — Assessment & Plan Note (Addendum)
Currently on Rosuvastatin 20 mg started and recheck lipid panel today.

## 2019-09-05 NOTE — Assessment & Plan Note (Addendum)
Doing very well. Well controlled today with A1C 5.9. Patient is very happy and congratulated her on her hard work. She looks forward to continuing lifestyle changes. Follow up in 3 months. No changes in medication. Obtaining labs today.

## 2019-09-05 NOTE — Assessment & Plan Note (Signed)
138/72 today. D/c'ed amlodipine at last visit. Will continue to monitor off of the amlodipine. If greater thatn 140/90 consistently, patient should return to the clinic to initiate BP medications. She is compliant with diet and exercise.

## 2019-09-05 NOTE — Progress Notes (Signed)
    SUBJECTIVE:   CHIEF COMPLAINT / HPI:   Diabetes follow up  Patient reports compliance with medication. She is taking metformin 500 mg once daily and rosuvastatin 20 mg. Patient is checking her feet daily. She has been compliant with diet and has 6 lb weight loss since last visit. She does report some  Peripheral neuropathy.   HTN Patient reports compliance with medication. She is taking lisinopril-HCTZ, metoprolol XL 200 mg. She denies any chest pain, SOB. She checks her blood pressure multiple times a week and has been well controlled at home.    PERTINENT  PMH / PSH: DM, HTN  OBJECTIVE:   BP 138/72   Pulse 77   Wt (!) 310 lb 6.4 oz (140.8 kg)   LMP 09/29/2010   SpO2 99%   BMI 47.20 kg/m   General: well appearing, happy female, NAD Chest: RRR, no m/r/g Lungs: CTAB MSK: No BLEE   ASSESSMENT/PLAN:   Type 2 diabetes mellitus without complication, without long-term current use of insulin ([place]) Doing very well. Well controlled today with A1C 5.9. Patient is very happy and congratulated her on her hard work. She looks forward to continuing lifestyle changes. Follow up in 3 months. No changes in medication. Obtaining labs today.   HYPERTENSION, BENIGN SYSTEMIC 138/72 today. D/c'ed amlodipine at last visit. Will continue to monitor off of the amlodipine. If greater thatn 140/90 consistently, patient should return to the clinic to initiate BP medications. She is compliant with diet and exercise.      Wilber Oliphant, MD [place]

## 2019-09-28 ENCOUNTER — Ambulatory Visit: Payer: 59 | Admitting: Family Medicine

## 2019-10-10 MED FILL — FLUARIX QUADRIVALENT 0.5 ML: 0.5 | 1 days supply | Qty: 1 | Fill #0

## 2019-11-09 ENCOUNTER — Other Ambulatory Visit (HOSPITAL_COMMUNITY): Payer: Self-pay | Admitting: *Deleted

## 2019-11-09 MED FILL — PENICILLIN VK 500 MG TABLET: 500 | 7 days supply | Qty: 28 | Fill #0

## 2019-11-09 MED FILL — ROSUVASTATIN CALCIUM 20 MG: 20 | 90 days supply | Qty: 90 | Fill #2

## 2019-11-12 ENCOUNTER — Other Ambulatory Visit (HOSPITAL_COMMUNITY): Payer: Self-pay | Admitting: *Deleted

## 2019-11-12 MED FILL — HYDROCODON-APAP 5-325: 5-325 | 3 days supply | Qty: 14 | Fill #0

## 2019-12-02 MED FILL — METOPROLOL SUCCINATE ER 200: 200 | 90 days supply | Qty: 90 | Fill #1

## 2019-12-02 MED FILL — LISINOPRIL-HCTZ 20-25 MG TA: 20-25 | 90 days supply | Qty: 90 | Fill #1

## 2020-02-06 ENCOUNTER — Other Ambulatory Visit (HOSPITAL_COMMUNITY): Payer: Self-pay | Admitting: Student

## 2020-02-08 MED FILL — CYCLOBENZAPRINE HCL 10 MG T: 10 | 10 days supply | Qty: 30 | Fill #0

## 2020-02-08 MED FILL — ROSUVASTATIN CALCIUM 20 MG: 20 | 90 days supply | Qty: 90 | Fill #3

## 2020-03-02 MED FILL — LISINOPRIL-HCTZ 20-25 MG TA: 20-25 | 90 days supply | Qty: 90 | Fill #2

## 2020-03-02 MED FILL — METOPROLOL SUCCINATE ER 200: 200 | 90 days supply | Qty: 90 | Fill #2

## 2020-03-08 ENCOUNTER — Other Ambulatory Visit: Payer: Self-pay

## 2020-03-09 MED ORDER — METFORMIN HCL 500 MG PO TABS: 500.0000 mg | ORAL_TABLET | Freq: Every day | ORAL | 3 refills | Status: DC

## 2020-03-15 ENCOUNTER — Other Ambulatory Visit: Payer: Self-pay | Admitting: Family Medicine

## 2020-03-15 MED ORDER — METFORMIN HCL 500 MG PO TABS: 500.0000 mg | ORAL_TABLET | Freq: Every day | ORAL | 3 refills | Status: DC

## 2020-03-15 MED FILL — METFORMIN HCL 500 MG TABS: 500 | 90 days supply | Qty: 90 | Fill #0

## 2020-03-15 NOTE — Addendum Note (Signed)
Addended by: Talbot Grumbling on: 03/15/2020 04:34 PM   Modules accepted: Orders

## 2020-03-15 NOTE — Telephone Encounter (Signed)
Received phone call from Outpatient pharmacy regarding metformin rx. Upon chart review, rx was not sent electronically. Attempted to send electronically, unable to process. Called pharmacy and gave verbal order for metformin.   Talbot Grumbling, RN

## 2020-03-17 NOTE — Progress Notes (Signed)
    SUBJECTIVE:   Chief compliant/HPI:  Misty Maxwell is a 55 y.o. F who presents today for her annual physical and diabetes follow-up.   Healthcare Maintenance: Patient is due for pap smear. Remainder of health maintenance is UTD.  Diabetes Follow Up: Home medications include: Metformin 500mg  daily Patient endorses taking these medications as prescribed She checks her sugars approximately once per week-- typically range in the high 90s, low 100s  Most recent A1Cs:  Lab Results  Component Value Date   HGBA1C 6.0 (A) 03/18/2020   HGBA1C 5.9 09/04/2019   HGBA1C 6.2 05/07/2019   Last Microalbumin, LDL, Creatinine: Lab Results  Component Value Date   MICROALBUR 10 05/07/2019   [place] 70 09/04/2019   CREATININE 0.77 09/04/2019   Patient is not up to date on diabetic eye-- has appointment scheduled in 1 month Patient is up to date on diabetic foot exam.   PMHx: Obesity, HTN, T2DM, HLD  OBJECTIVE:   BP (!) 153/78   Pulse 71   Ht 5\' 8"  (1.727 m)   Wt (!) 323 lb (146.5 kg)   LMP 09/29/2010   SpO2 98%   BMI 49.11 kg/m   Gen: alert, well-appearing CV: RRR, normal S1/S2 without m/r/g Resp: normal WOB on room air, lungs CTAB Abd: soft, nontender, nondistended Pelvic: normal female external genitalia, no lesions. Cervix visualized, nonfriable, no masses or lesions  Ext: Trace pitting edema of bilateral ankles  ASSESSMENT/PLAN:   Annual Examination  PHQ score 0, reviewed.  BP reviewed-- not at goal. She will return in 1 month for f/u on her blood pressure   Cervical cancer screening: due for Pap today, cytology + HPV ordered Breast cancer screening: last mammogram 09/03/2019 was normal, BI-RADS 1, repeat in 2 years Colorectal cancer screening: up to date on screening for CRC.  Vaccinations: given printed Rx for shingles vaccine. Remainder of vaccinations are up to date.  Type 2 diabetes mellitus without complication, without long-term current use of insulin  ([place]) Very well-controlled. A1c 6.0% today. -Continue Metformin 500mg  daily -Patient has diabetic eye exam scheduled in 1 month -Counseled on lifestyle modifications to achieve healthier BMI  Obesity BMI 49. Patient's weight is 323lbs today, up from 310 at last visit (August 2021), but down from 340s in the past. -Counseled on lifestyle modifications to achieve healthier BMI    Follow up in 1 month to recheck/discuss blood pressure and weight   Alcus Dad, MD [place]

## 2020-03-18 ENCOUNTER — Ambulatory Visit: Payer: 59 | Admitting: Family Medicine

## 2020-03-18 ENCOUNTER — Encounter: Payer: Self-pay | Admitting: Family Medicine

## 2020-03-18 ENCOUNTER — Other Ambulatory Visit: Payer: Self-pay

## 2020-03-18 ENCOUNTER — Other Ambulatory Visit (HOSPITAL_COMMUNITY)
Admission: RE | Admit: 2020-03-18 | Discharge: 2020-03-18 | Disposition: A | Discharge status: Home / Self Care | Payer: 59 | Source: Ambulatory Visit | Attending: Family Medicine | Admitting: Family Medicine

## 2020-03-18 VITALS — BP 153/78 | HR 71 | Ht 68.0 in | Wt 323.0 lb

## 2020-03-18 DIAGNOSIS — Z0000 Encounter for general adult medical examination without abnormal findings: Secondary | ICD-10-CM | POA: Insufficient documentation

## 2020-03-18 DIAGNOSIS — Z289 Immunization not carried out for unspecified reason: Secondary | ICD-10-CM

## 2020-03-18 DIAGNOSIS — E119 Type 2 diabetes mellitus without complications: Secondary | ICD-10-CM

## 2020-03-18 LAB — POCT GLYCOSYLATED HEMOGLOBIN (HGB A1C): Hemoglobin A1C: 6 % — AB (ref 4.0–5.6)

## 2020-03-18 MED ORDER — SHINGRIX 50 MCG/0.5ML IM SUSR: 0.5000 mL | Freq: Once | INTRAMUSCULAR | 0 refills | Status: AC

## 2020-03-18 NOTE — Assessment & Plan Note (Addendum)
Very well-controlled. A1c 6.0% today. -Continue Metformin 500mg  daily -Patient has diabetic eye exam scheduled in 1 month -Counseled on lifestyle modifications to achieve healthier BMI

## 2020-03-18 NOTE — Patient Instructions (Addendum)
It was great to see you!  Our plans for today:  - I will send you a mychart message with your pap results - Try to walk for 10 minutes every night after dinner  I'd like to see you again in about 1 month to check in on your blood pressure and weight.  Take care and seek immediate care sooner if you develop any concerns.   Dr. [facility]

## 2020-03-18 NOTE — Assessment & Plan Note (Signed)
BMI 49. Patient's weight is 323lbs today, up from 310 at last visit (August 2021), but down from 340s in the past. -Counseled on lifestyle modifications to achieve healthier BMI

## 2020-03-22 LAB — CYTOLOGY - PAP
Comment: NEGATIVE
Diagnosis: NEGATIVE
High risk HPV: NEGATIVE

## 2020-03-28 ENCOUNTER — Other Ambulatory Visit (HOSPITAL_COMMUNITY): Payer: Self-pay | Admitting: Internal Medicine

## 2020-03-28 MED FILL — SHINGRIX 50 MCG SUS: 50 | 1 days supply | Qty: 1 | Fill #0

## 2020-04-01 MED FILL — METFORMIN HCL 500 MG TABS: 500 | 90 days supply | Qty: 90 | Fill #0

## 2020-04-17 ENCOUNTER — Encounter (HOSPITAL_COMMUNITY): Payer: Self-pay | Admitting: *Deleted

## 2020-04-17 ENCOUNTER — Ambulatory Visit (HOSPITAL_COMMUNITY)
Admission: EM | Admit: 2020-04-17 | Discharge: 2020-04-17 | Disposition: A | Discharge status: Home / Self Care | Payer: 59 | Attending: Emergency Medicine | Admitting: Emergency Medicine

## 2020-04-17 ENCOUNTER — Ambulatory Visit (INDEPENDENT_AMBULATORY_CARE_PROVIDER_SITE_OTHER): Payer: 59

## 2020-04-17 ENCOUNTER — Other Ambulatory Visit: Payer: Self-pay

## 2020-04-17 DIAGNOSIS — M545 Low back pain, unspecified: Secondary | ICD-10-CM

## 2020-04-17 MED ORDER — METHYLPREDNISOLONE 4 MG PO TBPK: ORAL_TABLET | Freq: Every day | ORAL | 0 refills | Status: DC

## 2020-04-17 MED ORDER — LIDOCAINE 5 % EX PTCH: 1.0000 | MEDICATED_PATCH | CUTANEOUS | 0 refills | Status: DC

## 2020-04-17 MED ORDER — IBUPROFEN 600 MG PO TABS: 600.0000 mg | ORAL_TABLET | Freq: Four times a day (QID) | ORAL | 0 refills | Status: DC | PRN

## 2020-04-17 MED ORDER — ACETAMINOPHEN 325 MG PO TABS
ORAL_TABLET | ORAL | Status: AC
  Filled 2020-04-17: qty 3

## 2020-04-17 MED ORDER — TIZANIDINE HCL 4 MG PO TABS: 4.0000 mg | ORAL_TABLET | Freq: Three times a day (TID) | ORAL | 0 refills | Status: DC | PRN

## 2020-04-17 MED ORDER — KETOROLAC TROMETHAMINE 30 MG/ML IJ SOLN
INTRAMUSCULAR | Status: AC
  Filled 2020-04-17: qty 1

## 2020-04-17 MED ORDER — ACETAMINOPHEN 325 MG PO TABS
975.0000 mg | ORAL_TABLET | Freq: Once | ORAL | Status: AC
  Administered 2020-04-17: 975 mg via ORAL

## 2020-04-17 MED ORDER — KETOROLAC TROMETHAMINE 30 MG/ML IJ SOLN
30.0000 mg | Freq: Once | INTRAMUSCULAR | Status: AC
  Administered 2020-04-17: 30 mg via INTRAMUSCULAR

## 2020-04-17 NOTE — Discharge Instructions (Addendum)
Your x-ray showed that your hardware is in place.  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  Discontinue other pain relievers.  Take 600 mg of ibuprofen combined with 1000 mg of Tylenol together 3-4 times a day as needed for pain.  Try the Zanaflex as well.  Medrol Dosepak may be helpful, but I would wait a day or 2 and see if the Tylenol/ibuprofen/Zanaflex make you feel better first.  You can start the Medrol Dosepak if it is not.  It will elevate your sugars.  Please keep a close eye on your glucose if you do decide to start the Medrol Dosepak.  Continue heating pad, warm soaks, back brace.  I have written you a 2-day work note in case you need it.  Follow-up with Dr. Trenton Gammon if you are not getting any better.

## 2020-04-17 NOTE — ED Provider Notes (Signed)
HPI  SUBJECTIVE:  Misty Maxwell is a 55 y.o. female who presents with sharp, constant, stabbing midline low back pain starting 8 days ago after lifting up a trash bag at work.  She states the pain is located where her L4/L5 surgical site is.  She states she feels as if she is "being sliced in half".  She has tried Flexeril, Tylenol 500 mg, heat, a combination Bayer pain medication, stretching, a leftover OxyContin and a brace.  The brace helps.  Symptoms are worse from going from lying to sitting to standing, sudden movements.  No antipyretic in the past 6 hours. denies  fevers, abdominal pain, urinary urgency, frequency, dysuria, cloudy or odorous urine, hematuria. No saddle anesthesia, distal weakness/numbness, bilateral radicular leg pain/weakness, fevers/night sweats, recent h/o trauma, neurological deficits,  bladder/ bowel incontinence, urinary retention, h/o CA / multiple myleoma, pain worse at night. States feels similar to previous episodes of back pain- She states that she had similar symptoms last year but that they resolved on their own several days after onset.  Patient has a past medical history of hypertension, well-controlled diabetes, hypercholesterolemia, arthritis, she is status post L4/L5 lumbar fusion, glaucoma.  No history of chronic kidney disease, peptic ulcer disease, GI bleed, aortic abdominal aneurysm, DKA.  PMD: [facility].  This is not a workers comp case. Marland Kitchen   Past Medical History:  Diagnosis Date  . Arthritis    denies  . Diabetes mellitus without complication ([place])   . Glaucoma   . Hypertension   . Impaired glucose tolerance   . Obesity     Past Surgical History:  Procedure Laterality Date  . COLONOSCOPY N/A 01/05/2016   Procedure: COLONOSCOPY;  Surgeon: Arta Silence, MD;  Location: [facility];  Service: Endoscopy;  Laterality: N/A;    Family History  Problem Relation Age of Onset  . Diabetes Father   . Stroke Father   .  Aneurysm Father   . Stroke Maternal Grandmother   . Diabetes Paternal Grandmother   . Hypertension Mother     Social History   Tobacco Use  . Smoking status: Never Smoker  . Smokeless tobacco: Never Used  Vaping Use  . Vaping Use: Never used  Substance Use Topics  . Alcohol use: Not Currently  . Drug use: No    No current facility-administered medications for this encounter.  Current Outpatient Medications:  .  ibuprofen (ADVIL) 600 MG tablet, Take 1 tablet (600 mg total) by mouth every 6 (six) hours as needed., Disp: 30 tablet, Rfl: 0 .  lidocaine (LIDODERM) 5 %, Place 1 patch onto the skin daily. Remove & Discard patch within 12 hours or as directed by MD, Disp: 30 patch, Rfl: 0 .  methylPREDNISolone (MEDROL DOSEPAK) 4 MG TBPK tablet, Take by mouth daily. Follow package instructions, Disp: 21 tablet, Rfl: 0 .  tiZANidine (ZANAFLEX) 4 MG tablet, Take 1 tablet (4 mg total) by mouth every 8 (eight) hours as needed for muscle spasms., Disp: 30 tablet, Rfl: 0 .  aspirin 81 MG tablet, Take 1 tablet (81 mg total) by mouth daily., Disp: 30 tablet, Rfl: 1 .  bimatoprost (LUMIGAN) 0.03 % ophthalmic drops, Place 1 drop into both eyes at bedtime., Disp: , Rfl:  .  dorzolamide-timolol (COSOPT) 22.3-6.8 MG/ML ophthalmic solution, Place 1 drop into both eyes at bedtime. , Disp: , Rfl:  .  DULoxetine (CYMBALTA) 60 MG capsule, Take 1 capsule (60 mg total) by mouth daily. (Patient not taking: Reported on  03/18/2020), Disp: 30 capsule, Rfl: 2 .  glucose blood (FREESTYLE TEST STRIPS) test strip, Use as instructed, Disp: 100 each, Rfl: 12 .  glucose monitoring kit (FREESTYLE) monitoring kit, 1 each by Does not apply route as needed for other., Disp: 1 each, Rfl: 1 .  lisinopril-hydrochlorothiazide (ZESTORETIC) 20-25 MG tablet, Take 1 tablet by mouth daily., Disp: 90 tablet, Rfl: 3 .  Melatonin 5 MG CAPS, Take 10 mg by mouth at bedtime as needed (sleep)., Disp: , Rfl:  .  metFORMIN (GLUCOPHAGE) 500 MG  tablet, Take 1 tablet (500 mg total) by mouth daily with breakfast., Disp: 90 tablet, Rfl: 3 .  metoprolol (TOPROL-XL) 200 MG 24 hr tablet, Take 1 tablet (200 mg total) by mouth daily., Disp: 90 tablet, Rfl: 3 .  rosuvastatin (CRESTOR) 20 MG tablet, Take 1 tablet (20 mg total) by mouth daily., Disp: 90 tablet, Rfl: 3  No Known Allergies   ROS  As noted in HPI.   Physical Exam  BP (!) 110/56 (BP Location: Right Arm)   Pulse 77   Temp 98.2 F (36.8 C)   Resp 20   LMP 09/29/2010   SpO2 100%   Constitutional: Well developed, well nourished, appears uncomfortable.  Back pain aggravated with going from sitting to standing, lying to sitting. Eyes:  EOMI, conjunctiva normal bilaterally HENT: Normocephalic, atraumatic,mucus membranes moist Respiratory: Normal inspiratory effort Cardiovascular: Normal rate GI: nondistended. No suprapubic tenderness skin: No rash, skin intact Musculoskeletal: no CVAT. - paralumbar tenderness, - muscle spasm. No bony tenderness over L-spine, SI joint, sacrum.. Bilateral lower extremities nontender, baseline ROM with intact DP pulses, pain aggravated with right hip flexion against resistance.  No pain with int/ext rotation/extension hips bilaterally. SLR neg bilaterally. Sensation baseline light touch bilaterally for Pt, DTR's symmetric and intact bilaterally KJ, Motor symmetric bilateral 5/5 hip flexion, quadriceps, hamstrings, EHL, foot dorsiflexion, foot plantarflexion, gait antalgic but without apparent new ataxia. Neurologic: Alert & oriented x 3, no focal neuro deficits Psychiatric: Speech and behavior appropriate   ED Course   Medications  acetaminophen (TYLENOL) tablet 975 mg (975 mg Oral Given 04/17/20 1626)  ketorolac (TORADOL) 30 MG/ML injection 30 mg (30 mg Intramuscular Given 04/17/20 1626)    Orders Placed This Encounter  Procedures  . DG Lumbar Spine Complete    Standing Status:   Standing    Number of Occurrences:   1    Order Specific  Question:   Reason for Exam (SYMPTOM  OR DIAGNOSIS REQUIRED)    Answer:   Midline low back pain status post L4/5 fusion evaluate for acute changes    Order Specific Question:   Is patient pregnant?    Answer:   No    No results found for this or any previous visit (from the past 24 hour(s)). DG Lumbar Spine Complete  Result Date: 04/17/2020 CLINICAL DATA:  Low back pain EXAM: LUMBAR SPINE - COMPLETE 4+ VIEW COMPARISON:  05/21/2019 FINDINGS: Changes of posterior fusion again noted at L4-5. No hardware bony complicating feature. Degenerative facet disease at L5-S1 with 6 mm of anterolisthesis of L5 on S1. No fracture. Early disc space narrowing at L5-S1. IMPRESSION: Prior posterior fusion L4-5. 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 No acute bony abnormality. Electronically Signed   By: Rolm Baptise M.D.   On: 04/17/2020 16:47    ED Clinical Impression  1. Acute midline low back pain without sciatica     ED Assessment/Plan  Patient has no urinary  complaints.  Will get imaging to rule out hardware shift.  Giving Tylenol/Toradol.  Plan to send home with Zanaflex, Tylenol/ibuprofen, we discussed Medrol Dosepak and discussed that it will elevate her sugars, she has opted to try it.  She states that her diabetes is very well controlled normally.  We will have her monitor her glucose closely while on it.  Continue back brace.  Work note for 2 days because she works in [employer] and does lifting..  Follow-up with neurosurgeon if not getting better with conservative treatment.  Reviewed imaging independently.  No hardware or bony complicating feature.  Degenerative facet and disc disease L5/S1 with grade 1 anterolisthesis.  See radiology report for details.    X-ray negative for any acute changes.  Plan as above. Discussed  imaging, MDM, treatment plan, and plan for follow-up with patient. Discussed sn/sx that should prompt return to the ED. patient agrees  with plan.   Meds ordered this encounter  Medications  . acetaminophen (TYLENOL) tablet 975 mg  . ketorolac (TORADOL) 30 MG/ML injection 30 mg  . ibuprofen (ADVIL) 600 MG tablet    Sig: Take 1 tablet (600 mg total) by mouth every 6 (six) hours as needed.    Dispense:  30 tablet    Refill:  0  . methylPREDNISolone (MEDROL DOSEPAK) 4 MG TBPK tablet    Sig: Take by mouth daily. Follow package instructions    Dispense:  21 tablet    Refill:  0  . lidocaine (LIDODERM) 5 %    Sig: Place 1 patch onto the skin daily. Remove & Discard patch within 12 hours or as directed by MD    Dispense:  30 patch    Refill:  0  . tiZANidine (ZANAFLEX) 4 MG tablet    Sig: Take 1 tablet (4 mg total) by mouth every 8 (eight) hours as needed for muscle spasms.    Dispense:  30 tablet    Refill:  0    *This clinic note was created using [employer]. Therefore, there may be occasional mistakes despite careful proofreading.  ?     Melynda Ripple, MD 04/17/20 [id number]

## 2020-04-17 NOTE — ED Triage Notes (Signed)
PT reports Back pain started last Sat. Pt reports difficulty getting to a standing position. Pt reports pain feels like a saw is cutting her inhalf. Pt does have a back brace on .

## 2020-04-27 NOTE — Progress Notes (Signed)
    SUBJECTIVE:   CHIEF COMPLAINT / HPI:   Hypertension Patient is a 55 y.o. female who presents today for follow up of hypertension.  Home medications include: lisinopril-HCTZ 20-25 and metoprolol 200mg  daily Patient endorses taking these medications as prescribed with excellent compliance. Patient does not check blood pressure at home.  Most recent creatinine trend:  Lab Results  Component Value Date   CREATININE 0.77 09/04/2019   CREATININE 0.74 12/22/2018   CREATININE 0.72 02/12/2018    Back Pain Follow-Up Patient was seen in the ED on 04/17/20 for acute back pain-- xray was negative for acute changes but showed degenerative disease and grade 1 anterolisthesis of L5/S1. She was given Rx for Medrol dosepak and Zanaflex (tizanidine).  Patient reports her back pain is improved. She still feels stiff and has pain with certain movements, but has returned to work. She does not have any additional concerns about her back pain today and is followed by a neurosurgery/spine center who she will reach out to as needed.  PERTINENT  PMH / PSH: HTN, T2DM, HLD, s/p L4-L5 fusion  OBJECTIVE:   BP (!) 142/66   Pulse 66   Wt (!) 320 lb 4 oz (145.3 kg)   LMP 09/29/2010   SpO2 97%   BMI 48.69 kg/m   Gen: obese, NAD CV: RRR, normal S1/S2 Back: tenderness to palpation of L lumbar paraspinal muscles, ROM (flexion, extension, and rotation) mildly limited 2/2 pain Ext: no peripheral edema Neuro: grossly intact, normal gait  ASSESSMENT/PLAN:   OBESITY, MORBID BMI 48. Patient's weight today: 320lb (down from 323 at last visit). -Congratulated patient on 3lb weight loss -Extensive exercise and nutrition counseling provided -Patient is motivated to lose weight/achieve a healthier BMI  HYPERTENSION, BENIGN SYSTEMIC BP above goal today, 142/66 (repeat 144/70). However, on chart review BP appears to be fairly labile. At recent ED visit it was 110/56. -Continue current meds (lisinopril-HCTZ  20-25mg  and metoprolol 200mg  daily) -Will obtain BMP today -Patient will check BP at home and bring log to next visit -Counseled on dietary modifications to improve HTN (DASH diet, low sodium)    Alcus Dad, MD [place]

## 2020-04-28 ENCOUNTER — Other Ambulatory Visit: Payer: Self-pay

## 2020-04-28 ENCOUNTER — Ambulatory Visit: Payer: 59 | Admitting: Family Medicine

## 2020-04-28 ENCOUNTER — Encounter: Payer: Self-pay | Admitting: Family Medicine

## 2020-04-28 VITALS — BP 142/66 | HR 66 | Wt 320.2 lb

## 2020-04-28 DIAGNOSIS — I10 Essential (primary) hypertension: Secondary | ICD-10-CM | POA: Diagnosis not present

## 2020-04-28 NOTE — Patient Instructions (Addendum)
It was great to see you!  We are checking some labs today, I will call you if they are abnormal.   Keep up the great work with portion control and healthy eating choices.  I recommend you gradually increase your activity level and work up to 150 minutes of physical activity per week. Walking is a simple and easy choice. Park further away and take the stairs whenever you have the option!  Your blood pressure is slightly elevated today. I have included dietary recommendations below to help with this. I have also increased the dose of your lisinopril-HCTZ.  Take care and seek immediate care sooner if you develop any concerns.   Dr. [facility]  DASH Eating Plan DASH stands for Dietary Approaches to Stop Hypertension. The DASH eating plan is a healthy eating plan that has been shown to:  Reduce high blood pressure (hypertension).  Reduce your risk for type 2 diabetes, heart disease, and stroke.  Help with weight loss. What are tips for following this plan? Reading food labels  Check food labels for the amount of salt (sodium) per serving. Choose foods with less than 5 percent of the Daily Value of sodium. Generally, foods with less than 300 milligrams (mg) of sodium per serving fit into this eating plan.  To find whole grains, look for the word "whole" as the first word in the ingredient list. Shopping  Buy products labeled as "low-sodium" or "no salt added."  Buy fresh foods. Avoid canned foods and pre-made or frozen meals. Cooking  Avoid adding salt when cooking. Use salt-free seasonings or herbs instead of table salt or sea salt. Check with your health care provider or pharmacist before using salt substitutes.  Do not fry foods. Cook foods using healthy methods such as baking, boiling, grilling, roasting, and broiling instead.  Cook with heart-healthy oils, such as olive, canola, avocado, soybean, or sunflower oil. Meal planning  Eat a balanced diet that  includes: ? 4 or more servings of fruits and 4 or more servings of vegetables each day. Try to fill one-half of your plate with fruits and vegetables. ? 6-8 servings of whole grains each day. ? Less than 6 oz (170 g) of lean meat, poultry, or fish each day. A 3-oz (85-g) serving of meat is about the same size as a deck of cards. One egg equals 1 oz (28 g). ? 2-3 servings of low-fat dairy each day. One serving is 1 cup (237 mL). ? 1 serving of nuts, seeds, or beans 5 times each week. ? 2-3 servings of heart-healthy fats. Healthy fats called omega-3 fatty acids are found in foods such as walnuts, flaxseeds, fortified milks, and eggs. These fats are also found in cold-water fish, such as sardines, salmon, and mackerel.  Limit how much you eat of: ? Canned or prepackaged foods. ? Food that is high in trans fat, such as some fried foods. ? Food that is high in saturated fat, such as fatty meat. ? Desserts and other sweets, sugary drinks, and other foods with added sugar. ? Full-fat dairy products.  Do not salt foods before eating.  Do not eat more than 4 egg yolks a week.  Try to eat at least 2 vegetarian meals a week.  Eat more home-cooked food and less restaurant, buffet, and fast food.   Lifestyle  When eating at a restaurant, ask that your food be prepared with less salt or no salt, if possible.  If you drink alcohol: ? Limit  how much you use to:  0-1 drink a day for women who are not pregnant.  0-2 drinks a day for men. ? Be aware of how much alcohol is in your drink. In the [place] one drink equals one 12 oz bottle of beer (355 mL), one 5 oz glass of wine (148 mL), or one 1 oz glass of hard liquor (44 mL). General information  Avoid eating more than 2,300 mg of salt a day. If you have hypertension, you may need to reduce your sodium intake to 1,500 mg a day.  Work with your health care provider to maintain a healthy body weight or to lose weight. Ask what an ideal weight is for  you.  Get at least 30 minutes of exercise that causes your heart to beat faster (aerobic exercise) most days of the week. Activities may include walking, swimming, or biking.  Work with your health care provider or dietitian to adjust your eating plan to your individual calorie needs. What foods should I eat? Fruits All fresh, dried, or frozen fruit. Canned fruit in natural juice (without added sugar). Vegetables Fresh or frozen vegetables (raw, steamed, roasted, or grilled). Low-sodium or reduced-sodium tomato and vegetable juice. Low-sodium or reduced-sodium tomato sauce and tomato paste. Low-sodium or reduced-sodium canned vegetables. Grains Whole-grain or whole-wheat bread. Whole-grain or whole-wheat pasta. Brown rice. [place]. Bulgur. Whole-grain and low-sodium cereals. Pita bread. Low-fat, low-sodium crackers. Whole-wheat flour tortillas. Meats and other proteins Skinless chicken or [place]. Ground chicken or [place]. Pork with fat trimmed off. Fish and seafood. Egg whites. Dried beans, peas, or lentils. Unsalted nuts, nut butters, and seeds. Unsalted canned beans. Lean cuts of beef with fat trimmed off. Low-sodium, lean precooked or cured meat, such as sausages or meat loaves. Dairy Low-fat (1%) or fat-free (skim) milk. Reduced-fat, low-fat, or fat-free cheeses. Nonfat, low-sodium ricotta or cottage cheese. Low-fat or nonfat yogurt. Low-fat, low-sodium cheese. Fats and oils Soft margarine without trans fats. Vegetable oil. Reduced-fat, low-fat, or light mayonnaise and salad dressings (reduced-sodium). Canola, safflower, olive, avocado, soybean, and sunflower oils. Avocado. Seasonings and condiments Herbs. Spices. Seasoning mixes without salt. Other foods Unsalted popcorn and pretzels. Fat-free sweets. The items listed above may not be a complete list of foods and beverages you can eat. Contact a dietitian for more information. What foods should I avoid? Fruits Canned fruit in a  light or heavy syrup. Fried fruit. Fruit in cream or butter sauce. Vegetables Creamed or fried vegetables. Vegetables in a cheese sauce. Regular canned vegetables (not low-sodium or reduced-sodium). Regular canned tomato sauce and paste (not low-sodium or reduced-sodium). Regular tomato and vegetable juice (not low-sodium or reduced-sodium). Angie Fava. Olives. Grains Baked goods made with fat, such as croissants, muffins, or some breads. Dry pasta or rice meal packs. Meats and other proteins Fatty cuts of meat. Ribs. Fried meat. Berniece Salines. Bologna, salami, and other precooked or cured meats, such as sausages or meat loaves. Fat from the back of a pig (fatback). Bratwurst. Salted nuts and seeds. Canned beans with added salt. Canned or smoked fish. Whole eggs or egg yolks. Chicken or [place] with skin. Dairy Whole or 2% milk, cream, and half-and-half. Whole or full-fat cream cheese. Whole-fat or sweetened yogurt. Full-fat cheese. Nondairy creamers. Whipped toppings. Processed cheese and cheese spreads. Fats and oils Butter. Stick margarine. Lard. Shortening. Ghee. Bacon fat. Tropical oils, such as coconut, palm kernel, or palm oil. Seasonings and condiments Onion salt, garlic salt, seasoned salt, table salt, and sea salt. Worcestershire sauce. Tartar sauce.  Barbecue sauce. Teriyaki sauce. Soy sauce, including reduced-sodium. Steak sauce. Canned and packaged gravies. Fish sauce. Oyster sauce. Cocktail sauce. Store-bought horseradish. Ketchup. Mustard. Meat flavorings and tenderizers. Bouillon cubes. Hot sauces. Pre-made or packaged marinades. Pre-made or packaged taco seasonings. Relishes. Regular salad dressings. Other foods Salted popcorn and pretzels. The items listed above may not be a complete list of foods and beverages you should avoid. Contact a dietitian for more information. Where to find more information  National Heart, Lung, and Blood Institute: https://wilson-eaton.com/  American Heart Association:  www.heart.org  Academy of Nutrition and Dietetics: www.eatright.[place]: www.kidney.org Summary  The DASH eating plan is a healthy eating plan that has been shown to reduce high blood pressure (hypertension). It may also reduce your risk for type 2 diabetes, heart disease, and stroke.  When on the DASH eating plan, aim to eat more fresh fruits and vegetables, whole grains, lean proteins, low-fat dairy, and heart-healthy fats.  With the DASH eating plan, you should limit salt (sodium) intake to 2,300 mg a day. If you have hypertension, you may need to reduce your sodium intake to 1,500 mg a day.  Work with your health care provider or dietitian to adjust your eating plan to your individual calorie needs. This information is not intended to replace advice given to you by your health care provider. Make sure you discuss any questions you have with your health care provider. Document Revised: 12/19/2018 Document Reviewed: 12/19/2018 Elsevier Patient Education  2021 [employer].

## 2020-04-28 NOTE — Assessment & Plan Note (Signed)
BMI 48. Patient's weight today: 320lb (down from 323 at last visit). -Congratulated patient on 3lb weight loss -Extensive exercise and nutrition counseling provided -Patient is motivated to lose weight/achieve a healthier BMI

## 2020-04-28 NOTE — Assessment & Plan Note (Addendum)
BP above goal today, 142/66 (repeat 144/70). However, on chart review BP appears to be fairly labile. At recent ED visit it was 110/56. -Continue current meds (lisinopril-HCTZ 20-25mg  and metoprolol 200mg  daily) -Will obtain BMP today -Patient will check BP at home and bring log to next visit -Counseled on dietary modifications to improve HTN (DASH diet, low sodium)

## 2020-04-29 LAB — BASIC METABOLIC PANEL
BUN/Creatinine Ratio: 25 — ABNORMAL HIGH (ref 9–23)
BUN: 19 mg/dL (ref 6–24)
CO2: 19 mmol/L — ABNORMAL LOW (ref 20–29)
Calcium: 10 mg/dL (ref 8.7–10.2)
Chloride: 100 mmol/L (ref 96–106)
Creatinine, Ser: 0.75 mg/dL (ref 0.57–1.00)
Glucose: 111 mg/dL — ABNORMAL HIGH (ref 65–99)
Potassium: 4.5 mmol/L (ref 3.5–5.2)
Sodium: 140 mmol/L (ref 134–144)
eGFR: 95 mL/min/{1.73_m2} (ref >59)

## 2020-05-04 ENCOUNTER — Other Ambulatory Visit: Payer: Self-pay

## 2020-05-09 ENCOUNTER — Other Ambulatory Visit (HOSPITAL_COMMUNITY): Payer: Self-pay

## 2020-05-09 ENCOUNTER — Other Ambulatory Visit: Payer: Self-pay

## 2020-05-09 MED ORDER — ROSUVASTATIN CALCIUM 20 MG PO TABS
20.0000 mg | ORAL_TABLET | Freq: Every day | ORAL | 3 refills | Status: DC
  Filled 2020-05-09: qty 90, 90d supply, fill #0
  Filled 2020-08-05: qty 90, 90d supply, fill #1
  Filled 2020-11-03: qty 90, 90d supply, fill #2
  Filled 2021-01-31: qty 90, 90d supply, fill #3

## 2020-05-18 DIAGNOSIS — H5203 Hypermetropia, bilateral: Secondary | ICD-10-CM | POA: Diagnosis not present

## 2020-05-19 ENCOUNTER — Other Ambulatory Visit (HOSPITAL_COMMUNITY): Payer: Self-pay

## 2020-05-20 ENCOUNTER — Other Ambulatory Visit (HOSPITAL_COMMUNITY): Payer: Self-pay

## 2020-05-30 ENCOUNTER — Other Ambulatory Visit (HOSPITAL_COMMUNITY): Payer: Self-pay

## 2020-05-30 MED FILL — Lisinopril & Hydrochlorothiazide Tab 20-25 MG: ORAL | 90 days supply | Qty: 90 | Fill #0 | Status: AC

## 2020-05-30 MED FILL — Metoprolol Succinate Tab ER 24HR 200 MG (Tartrate Equiv): ORAL | 90 days supply | Qty: 90 | Fill #0 | Status: AC

## 2020-05-30 MED FILL — Zoster Vac Recombinant Adjuvanted for IM Inj 50 MCG/0.5ML: INTRAMUSCULAR | 1 days supply | Qty: 1 | Fill #0 | Status: AC

## 2020-06-08 LAB — HM DIABETES EYE EXAM

## 2020-06-29 ENCOUNTER — Other Ambulatory Visit (HOSPITAL_COMMUNITY): Payer: Self-pay

## 2020-06-29 MED FILL — Metformin HCl Tab 500 MG: ORAL | 90 days supply | Qty: 90 | Fill #0 | Status: AC

## 2020-07-07 ENCOUNTER — Other Ambulatory Visit (HOSPITAL_COMMUNITY): Payer: Self-pay

## 2020-08-05 ENCOUNTER — Other Ambulatory Visit (HOSPITAL_COMMUNITY): Payer: Self-pay

## 2020-08-06 ENCOUNTER — Other Ambulatory Visit (HOSPITAL_COMMUNITY): Payer: Self-pay

## 2020-08-08 ENCOUNTER — Other Ambulatory Visit (HOSPITAL_COMMUNITY): Payer: Self-pay

## 2020-08-16 ENCOUNTER — Other Ambulatory Visit: Payer: Self-pay | Admitting: Family Medicine

## 2020-08-16 DIAGNOSIS — Z1231 Encounter for screening mammogram for malignant neoplasm of breast: Secondary | ICD-10-CM

## 2020-08-30 ENCOUNTER — Other Ambulatory Visit: Payer: Self-pay | Admitting: Family Medicine

## 2020-08-30 ENCOUNTER — Other Ambulatory Visit (HOSPITAL_COMMUNITY): Payer: Self-pay

## 2020-08-30 DIAGNOSIS — I10 Essential (primary) hypertension: Secondary | ICD-10-CM

## 2020-08-31 ENCOUNTER — Other Ambulatory Visit (HOSPITAL_COMMUNITY): Payer: Self-pay

## 2020-09-01 ENCOUNTER — Other Ambulatory Visit (HOSPITAL_COMMUNITY): Payer: Self-pay

## 2020-09-01 MED ORDER — LISINOPRIL-HYDROCHLOROTHIAZIDE 20-25 MG PO TABS
1.0000 | ORAL_TABLET | Freq: Every day | ORAL | 3 refills | Status: DC
  Filled 2020-09-01: qty 90, 90d supply, fill #0
  Filled 2020-11-29: qty 90, 90d supply, fill #1
  Filled 2021-03-06: qty 90, 90d supply, fill #2
  Filled 2021-06-01: qty 90, 90d supply, fill #3

## 2020-09-01 MED ORDER — METOPROLOL SUCCINATE ER 200 MG PO TB24
200.0000 mg | ORAL_TABLET | Freq: Every day | ORAL | 3 refills | Status: DC
  Filled 2020-09-01: qty 90, 90d supply, fill #0
  Filled 2020-11-29: qty 90, 90d supply, fill #1
  Filled 2021-03-06: qty 90, 90d supply, fill #2
  Filled 2021-06-01: qty 90, 90d supply, fill #3

## 2020-09-06 DIAGNOSIS — H401131 Primary open-angle glaucoma, bilateral, mild stage: Secondary | ICD-10-CM | POA: Diagnosis not present

## 2020-09-06 DIAGNOSIS — E119 Type 2 diabetes mellitus without complications: Secondary | ICD-10-CM | POA: Diagnosis not present

## 2020-09-12 ENCOUNTER — Other Ambulatory Visit (HOSPITAL_COMMUNITY): Payer: Self-pay

## 2020-09-12 MED ORDER — DORZOLAMIDE HCL-TIMOLOL MAL 2-0.5 % OP SOLN
1.0000 [drp] | Freq: Two times a day (BID) | OPHTHALMIC | 2 refills | Status: DC
  Filled 2020-09-12: qty 10, 50d supply, fill #0

## 2020-09-12 MED ORDER — LUMIGAN 0.01 % OP SOLN
1.0000 [drp] | Freq: Every day | OPHTHALMIC | 4 refills | Status: DC
  Filled 2020-09-12: qty 7.5, 75d supply, fill #0

## 2020-09-29 ENCOUNTER — Other Ambulatory Visit (HOSPITAL_COMMUNITY): Payer: Self-pay

## 2020-09-29 MED FILL — Metformin HCl Tab 500 MG: ORAL | 90 days supply | Qty: 90 | Fill #1 | Status: AC

## 2020-10-10 ENCOUNTER — Other Ambulatory Visit: Payer: Self-pay

## 2020-10-10 ENCOUNTER — Ambulatory Visit
Admission: RE | Admit: 2020-10-10 | Discharge: 2020-10-10 | Disposition: A | Discharge status: Home / Self Care | Payer: 59 | Source: Ambulatory Visit | Attending: Family Medicine | Admitting: Family Medicine

## 2020-10-10 DIAGNOSIS — Z1231 Encounter for screening mammogram for malignant neoplasm of breast: Secondary | ICD-10-CM | POA: Diagnosis not present

## 2020-11-03 ENCOUNTER — Other Ambulatory Visit (HOSPITAL_COMMUNITY): Payer: Self-pay

## 2020-11-29 ENCOUNTER — Other Ambulatory Visit (HOSPITAL_COMMUNITY): Payer: Self-pay

## 2021-01-03 ENCOUNTER — Other Ambulatory Visit (HOSPITAL_COMMUNITY): Payer: Self-pay

## 2021-01-03 MED FILL — Metformin HCl Tab 500 MG: ORAL | 90 days supply | Qty: 90 | Fill #2 | Status: AC

## 2021-01-31 ENCOUNTER — Other Ambulatory Visit (HOSPITAL_COMMUNITY): Payer: Self-pay

## 2021-03-06 ENCOUNTER — Other Ambulatory Visit (HOSPITAL_COMMUNITY): Payer: Self-pay

## 2021-03-20 ENCOUNTER — Other Ambulatory Visit: Payer: Self-pay

## 2021-03-20 ENCOUNTER — Other Ambulatory Visit (HOSPITAL_COMMUNITY): Payer: Self-pay

## 2021-03-20 ENCOUNTER — Ambulatory Visit: Payer: 59 | Admitting: Family Medicine

## 2021-03-20 ENCOUNTER — Encounter: Payer: Self-pay | Admitting: Family Medicine

## 2021-03-20 VITALS — BP 130/72 | HR 78 | Ht 68.0 in | Wt 341.1 lb

## 2021-03-20 DIAGNOSIS — E1169 Type 2 diabetes mellitus with other specified complication: Secondary | ICD-10-CM

## 2021-03-20 DIAGNOSIS — E119 Type 2 diabetes mellitus without complications: Secondary | ICD-10-CM | POA: Diagnosis not present

## 2021-03-20 DIAGNOSIS — E785 Hyperlipidemia, unspecified: Secondary | ICD-10-CM | POA: Diagnosis not present

## 2021-03-20 LAB — POCT GLYCOSYLATED HEMOGLOBIN (HGB A1C): HbA1c, POC (controlled diabetic range): 6.9 % (ref 0.0–7.0)

## 2021-03-20 MED ORDER — MOUNJARO 2.5 MG/0.5ML ~~LOC~~ SOAJ
2.5000 mg | SUBCUTANEOUS | 0 refills | Status: DC
  Filled 2021-03-20: qty 2, 28d supply, fill #0

## 2021-03-20 NOTE — Patient Instructions (Addendum)
It was wonderful to see you today. Thank you for allowing me to be a part of your care. Below is a short summary of what we discussed at your visit today:  Diabetes Your A1c today is up a little bit.  Today it was 6.9, compared to 6.0 last year on 03/18/2020.  Do not worry, you have a whole team working with you!  You are doing a really great job already and we are going to help get you to your goals.  You do not have to do this alone.  Mounjaro I have sent the prescription of Mounjaro to your pharmacy.  [facility] outpatient pharmacy.  You will inject 2.5 mg once weekly for 4 weeks.  If you tolerate this well without too much nausea or abdominal pain, we can continue using the medication and go up to a more therapeutic dose.  Please see the handout for more information.  Your pharmacist will also help to show you how to use the [employer] injector.  Nutritionist I have placed a referral to our on-site nutritionist for you. Her name is Dr. Jenne Campus. Her contact information is below. Please contact her office number directly to schedule an appointment.   Jeannie C. Jenne Campus, PhD, [place], LDN, [facility] [address] [place], [place] [address] [phone]    Please bring all of your medications to every appointment!  If you have any questions or concerns, please do not hesitate to contact us via phone or MyChart message.   Ezequiel Essex, MD

## 2021-03-20 NOTE — Progress Notes (Signed)
° ° °  SUBJECTIVE:   CHIEF COMPLAINT / HPI:   A1c follow up - A1c today 6.9, up slightly from 6.0 last year 03/18/2020 - first measurement in the diabetic range in a couple years - currently on metformin 500 mg, rosuvastatin 20 mg tablet - tolerating well, no adverse side effects, no hypoglycemic episodes  Lab Results  Component Value Date   HGBA1C 6.9 03/20/2021   HGBA1C 6.0 (A) 03/18/2020   HGBA1C 5.9 09/04/2019   Lab Results  Component Value Date   MICROALBUR 10 05/07/2019   LDLCALC 73 03/20/2021   CREATININE 0.75 04/28/2020    PERTINENT  PMH / PSH:  Patient Active Problem List   Diagnosis Date Noted   Hyperlipidemia associated with type 2 diabetes mellitus ([place]) 09/05/2019   Status post lumbar spinal fusion 05/21/2019   Degenerative spondylolisthesis 12/30/2018   Type 2 diabetes mellitus without complication, without long-term current use of insulin ([place]) 02/12/2018   OBESITY, MORBID 03/28/2006   GLAUCOMA 03/28/2006   HYPERTENSION, BENIGN SYSTEMIC 03/28/2006    OBJECTIVE:  BP 130/72    Pulse 78    Ht 5\' 8"  (1.727 m)    Wt (!) 341 lb 2 oz (154.7 kg)    LMP 09/29/2010    SpO2 99%    BMI 51.87 kg/m    PHQ-9:  Depression screen [facility] 2/9 03/20/2021 04/28/2020 03/18/2020  Decreased Interest 0 0 0  Down, Depressed, Hopeless 0 0 0  PHQ - 2 Score 0 0 0  Altered sleeping 0 0 0  Tired, decreased energy 0 0 0  Change in appetite 0 0 1  Feeling bad or failure about yourself  0 0 0  Trouble concentrating 0 0 0  Moving slowly or fidgety/restless 0 0 0  Suicidal thoughts 0 0 0  PHQ-9 Score 0 0 1  Difficult doing work/chores - - Not difficult at all    GAD-7: No flowsheet data found.   Physical Exam General: Awake, alert, oriented Cardiovascular: Regular rate and rhythm, S1 and S2 present, no murmurs auscultated Respiratory: Lung fields clear to auscultation bilaterally Extremities: No bilateral lower extremity edema, palpable pedal and pretibial pulses bilaterally Neuro:  Cranial nerves II through X grossly intact, able to move all extremities spontaneously   ASSESSMENT/PLAN:   Type 2 diabetes mellitus without complication, without long-term current use of insulin ([facility]) Worsened slightly. A1c 6.9 today, up from 6.0 last year. Currently on metformin 500 mg daily and rosuvastatin 20 mg. Tolerating well with no adverse side effects. Discussed medication options, patient amenable to mounjaro IM weekly and referral to Dr. Jenne Campus for nutrition counseling. Follow up 1 month for tolerance check and 3 months for A1c recheck.     Ezequiel Essex, MD [place]

## 2021-03-21 LAB — LIPID PANEL
Chol/HDL Ratio: 2.3 ratio (ref 0.0–4.4)
Cholesterol, Total: 150 mg/dL (ref 100–199)
HDL: 64 mg/dL (ref >39)
LDL Chol Calc (NIH): 73 mg/dL (ref 0–99)
Triglycerides: 64 mg/dL (ref 0–149)
VLDL Cholesterol Cal: 13 mg/dL (ref 5–40)

## 2021-03-24 NOTE — Assessment & Plan Note (Addendum)
Worsened slightly. A1c 6.9 today, up from 6.0 last year. Currently on metformin 500 mg daily and rosuvastatin 20 mg. Tolerating well with no adverse side effects. Discussed medication options, patient amenable to mounjaro IM weekly and referral to Dr. Jenne Campus for nutrition counseling. Follow up 1 month for tolerance check and 3 months for A1c recheck.

## 2021-04-04 ENCOUNTER — Other Ambulatory Visit (HOSPITAL_COMMUNITY): Payer: Self-pay

## 2021-04-04 ENCOUNTER — Other Ambulatory Visit: Payer: Self-pay | Admitting: Family Medicine

## 2021-04-04 DIAGNOSIS — H52201 Unspecified astigmatism, right eye: Secondary | ICD-10-CM | POA: Diagnosis not present

## 2021-04-04 DIAGNOSIS — H5201 Hypermetropia, right eye: Secondary | ICD-10-CM | POA: Diagnosis not present

## 2021-04-04 MED ORDER — METFORMIN HCL 500 MG PO TABS
500.0000 mg | ORAL_TABLET | Freq: Every day | ORAL | 3 refills | Status: DC
  Filled 2021-04-04: qty 90, 90d supply, fill #0
  Filled 2021-07-02: qty 90, 90d supply, fill #1
  Filled 2021-09-29: qty 90, 90d supply, fill #2
  Filled 2022-01-01: qty 90, 90d supply, fill #3

## 2021-04-12 ENCOUNTER — Encounter: Payer: Self-pay | Admitting: Family Medicine

## 2021-04-12 LAB — HM DIABETES EYE EXAM

## 2021-04-17 ENCOUNTER — Other Ambulatory Visit: Payer: Self-pay | Admitting: Family Medicine

## 2021-04-17 ENCOUNTER — Other Ambulatory Visit (HOSPITAL_COMMUNITY): Payer: Self-pay

## 2021-04-17 DIAGNOSIS — E1169 Type 2 diabetes mellitus with other specified complication: Secondary | ICD-10-CM

## 2021-04-17 DIAGNOSIS — E119 Type 2 diabetes mellitus without complications: Secondary | ICD-10-CM

## 2021-04-18 ENCOUNTER — Other Ambulatory Visit (HOSPITAL_COMMUNITY): Payer: Self-pay

## 2021-04-18 MED ORDER — MOUNJARO 5 MG/0.5ML ~~LOC~~ SOAJ
5.0000 mg | SUBCUTANEOUS | 1 refills | Status: DC
  Filled 2021-04-18: qty 2, 28d supply, fill #0
  Filled 2021-05-15: qty 2, 28d supply, fill #1

## 2021-05-08 ENCOUNTER — Other Ambulatory Visit: Payer: Self-pay | Admitting: Family Medicine

## 2021-05-08 ENCOUNTER — Other Ambulatory Visit (HOSPITAL_COMMUNITY): Payer: Self-pay

## 2021-05-08 MED ORDER — ROSUVASTATIN CALCIUM 20 MG PO TABS
20.0000 mg | ORAL_TABLET | Freq: Every day | ORAL | 3 refills | Status: DC
  Filled 2021-05-08: qty 90, 90d supply, fill #0
  Filled 2021-08-08: qty 90, 90d supply, fill #1
  Filled 2021-11-02: qty 90, 90d supply, fill #2
  Filled 2022-02-08: qty 90, 90d supply, fill #3

## 2021-05-15 ENCOUNTER — Other Ambulatory Visit (HOSPITAL_COMMUNITY): Payer: Self-pay

## 2021-06-01 ENCOUNTER — Other Ambulatory Visit (HOSPITAL_COMMUNITY): Payer: Self-pay

## 2021-06-11 ENCOUNTER — Other Ambulatory Visit: Payer: Self-pay | Admitting: Family Medicine

## 2021-06-11 DIAGNOSIS — E1169 Type 2 diabetes mellitus with other specified complication: Secondary | ICD-10-CM

## 2021-06-11 DIAGNOSIS — E119 Type 2 diabetes mellitus without complications: Secondary | ICD-10-CM

## 2021-06-12 ENCOUNTER — Other Ambulatory Visit (HOSPITAL_COMMUNITY): Payer: Self-pay

## 2021-06-12 MED ORDER — MOUNJARO 5 MG/0.5ML ~~LOC~~ SOAJ
5.0000 mg | SUBCUTANEOUS | 1 refills | Status: DC
  Filled 2021-06-12: qty 2, 28d supply, fill #0

## 2021-06-13 ENCOUNTER — Other Ambulatory Visit (HOSPITAL_COMMUNITY): Payer: Self-pay

## 2021-06-20 ENCOUNTER — Ambulatory Visit: Payer: 59 | Admitting: Family Medicine

## 2021-06-20 ENCOUNTER — Other Ambulatory Visit (HOSPITAL_COMMUNITY): Payer: Self-pay

## 2021-06-20 ENCOUNTER — Encounter: Payer: Self-pay | Admitting: Family Medicine

## 2021-06-20 VITALS — BP 124/80 | HR 85 | Wt 322.0 lb

## 2021-06-20 DIAGNOSIS — G629 Polyneuropathy, unspecified: Secondary | ICD-10-CM | POA: Diagnosis not present

## 2021-06-20 DIAGNOSIS — E119 Type 2 diabetes mellitus without complications: Secondary | ICD-10-CM

## 2021-06-20 DIAGNOSIS — E1169 Type 2 diabetes mellitus with other specified complication: Secondary | ICD-10-CM

## 2021-06-20 DIAGNOSIS — I10 Essential (primary) hypertension: Secondary | ICD-10-CM | POA: Diagnosis not present

## 2021-06-20 LAB — POCT GLYCOSYLATED HEMOGLOBIN (HGB A1C): HbA1c, POC (controlled diabetic range): 6.1 % (ref 0.0–7.0)

## 2021-06-20 MED ORDER — TIRZEPATIDE 7.5 MG/0.5ML ~~LOC~~ SOAJ
7.5000 mg | SUBCUTANEOUS | 0 refills | Status: DC
  Filled 2021-06-20: qty 6, 84d supply, fill #0

## 2021-06-20 MED ORDER — METOPROLOL SUCCINATE ER 100 MG PO TB24
100.0000 mg | ORAL_TABLET | Freq: Every day | ORAL | 0 refills | Status: DC
  Filled 2021-06-20: qty 90, 90d supply, fill #0

## 2021-06-20 MED ORDER — GABAPENTIN 300 MG PO CAPS
ORAL_CAPSULE | ORAL | 0 refills | Status: DC
  Filled 2021-06-20: qty 201, 74d supply, fill #0

## 2021-06-20 NOTE — Patient Instructions (Addendum)
It was great to see you!  Your A1c was 6.1% today, which is excellent. This means your diabetes is very well-controlled.  We are going to increase your Mounjaro dose to 7.'5mg'$  to help even further with weight loss.   We will reduce your metoprolol to '100mg'$  daily. Check your BP at home and bring the written log to your next appointment.  We will start Gabapentin to see if it helps with your neuropathy at all. Start with '300mg'$  at bedtime. Increase to twice daily after 1 week. Then increase to three times daily after 1 more week.  We will check your kidney numbers today- I will send you a Mychart message with the results.  Return in 6-8 weeks for follow up.  Take care, Dr Rock Nephew

## 2021-06-20 NOTE — Progress Notes (Unsigned)
    SUBJECTIVE:   CHIEF COMPLAINT / HPI:   Type 2 Diabetes Patient is a 56 y.o. female who presents today for diabetes follow-up.  Home medications include: Metformin '500mg'$  daily and Mounjaro '5mg'$  weekly Patient reports excellent medication compliance. Tolerating well without side effects- although she does endorse decreased appetite with Mounjaro. Patient checks sugar once weekly at home. Typically range 80s-110s fasting, low 100s postprandial.  Most recent A1Cs:  Lab Results  Component Value Date   HGBA1C 6.1 06/20/2021   HGBA1C 6.9 03/20/2021   HGBA1C 6.0 (A) 03/18/2020   Last Microalbumin, LDL, Creatinine: Lab Results  Component Value Date   MICROALBUR 10 05/07/2019   LDLCALC 73 03/20/2021   CREATININE 0.75 04/28/2020   Patient is up to date on diabetic eye. Patient is not up to date on diabetic foot exam.  Neuropathy Patient reports numbness sensation in bilateral feet. She thinks it may be related to her prior back surgery but it has become worse over past few months. Tried OTC neuropathy medication as well as neuropathy cream with no relief. She was on Gabapentin '300mg'$  TID in the past but doesn't remember it being particularly helpful.   HTN Home meds: lisinopril-HCTZ 20-'25mg'$  daily and metoprolol '200mg'$  daily. Excellent compliance. Checks BP at home and readings are almost always 110s/60s-70s. Wondering if she can reduce dose of her BP medications now that she is losing some weight and home readings are wnl.  PERTINENT  PMH / PSH: HTN, obesity  OBJECTIVE:   BP 124/80   Pulse 85   Wt (!) 322 lb (146.1 kg)   LMP 09/29/2010   SpO2 98%   BMI 48.96 kg/m   General: NAD, pleasant, obese, able to participate in exam Respiratory: No respiratory distress Skin: warm and dry, no rashes noted Psych: Normal affect and mood Neuro: grossly intact. Sensation intact to light touch throughout bilateral lower extremities and feet Ext: no lower extremity edema  ASSESSMENT/PLAN:    Type 2 diabetes mellitus without complication, without long-term current use of insulin ([place]) Very well-controlled. A1c 6.1% today. Continue Metformin '500mg'$  daily. Will increase Mounjaro to 7.'5mg'$  weekly for additional weight loss benefit. Obtain BMP today. Advised to schedule appointment with podiatry for foot care/exam. Recheck A1c in 6 months.  HYPERTENSION, BENIGN SYSTEMIC BP at goal- 124/80 at today's visit. Patient would like to trial reduced dose of metoprolol which is reasonable given home BP readings of 110s/60-70s. No hx of CHF or other indication for metoprolol. Continue lisinopril-HCTZ 20-25. Decrease toprol-xl to '100mg'$  daily. Continue home BP monitoring. F/u in 6-8 weeks  Neuropathy Patient with paresthesia sensation in bilateral feet for several months. Suspect this is related to her known back problems (now s/p lumbar fusion). Will trial Gabapentin starting with '300mg'$  qhs and increase to '300mg'$  TID over a few weeks.  Alcus Dad, MD [place]

## 2021-06-21 ENCOUNTER — Other Ambulatory Visit (HOSPITAL_COMMUNITY): Payer: Self-pay

## 2021-06-21 DIAGNOSIS — G629 Polyneuropathy, unspecified: Secondary | ICD-10-CM | POA: Insufficient documentation

## 2021-06-21 LAB — BASIC METABOLIC PANEL
BUN/Creatinine Ratio: 19 (ref 9–23)
BUN: 20 mg/dL (ref 6–24)
CO2: 22 mmol/L (ref 20–29)
Calcium: 10.1 mg/dL (ref 8.7–10.2)
Chloride: 101 mmol/L (ref 96–106)
Creatinine, Ser: 1.07 mg/dL — ABNORMAL HIGH (ref 0.57–1.00)
Glucose: 87 mg/dL (ref 70–99)
Potassium: 4.7 mmol/L (ref 3.5–5.2)
Sodium: 142 mmol/L (ref 134–144)
eGFR: 61 mL/min/{1.73_m2} (ref >59)

## 2021-06-21 NOTE — Assessment & Plan Note (Addendum)
BP at goal- 124/80 at today's visit. Patient would like to trial reduced dose of metoprolol which is reasonable given home BP readings of 110s/60-70s. No hx of CHF or other indication for metoprolol. Continue lisinopril-HCTZ 20-25. Decrease toprol-xl to '100mg'$  daily. Continue home BP monitoring. F/u in 6-8 weeks

## 2021-06-21 NOTE — Assessment & Plan Note (Addendum)
Patient with paresthesia sensation in bilateral feet for several months. Suspect this is related to her known back problems (now s/p lumbar fusion). Will trial Gabapentin starting with '300mg'$  qhs and increase to '300mg'$  TID over a few weeks.

## 2021-06-21 NOTE — Assessment & Plan Note (Signed)
Very well-controlled. A1c 6.1% today. Continue Metformin '500mg'$  daily. Will increase Mounjaro to 7.'5mg'$  weekly for additional weight loss benefit. Obtain BMP today. Advised to schedule appointment with podiatry for foot care/exam. Recheck A1c in 6 months.

## 2021-07-03 ENCOUNTER — Other Ambulatory Visit (HOSPITAL_COMMUNITY): Payer: Self-pay

## 2021-07-04 ENCOUNTER — Encounter: Payer: Self-pay | Admitting: *Deleted

## 2021-08-08 ENCOUNTER — Other Ambulatory Visit (HOSPITAL_COMMUNITY): Payer: Self-pay

## 2021-08-16 DIAGNOSIS — H401131 Primary open-angle glaucoma, bilateral, mild stage: Secondary | ICD-10-CM | POA: Diagnosis not present

## 2021-08-16 DIAGNOSIS — E119 Type 2 diabetes mellitus without complications: Secondary | ICD-10-CM | POA: Diagnosis not present

## 2021-08-27 ENCOUNTER — Other Ambulatory Visit: Payer: Self-pay | Admitting: Family Medicine

## 2021-08-27 DIAGNOSIS — I10 Essential (primary) hypertension: Secondary | ICD-10-CM

## 2021-08-27 DIAGNOSIS — G629 Polyneuropathy, unspecified: Secondary | ICD-10-CM

## 2021-08-28 ENCOUNTER — Other Ambulatory Visit (HOSPITAL_COMMUNITY): Payer: Self-pay

## 2021-08-28 MED ORDER — LISINOPRIL-HYDROCHLOROTHIAZIDE 20-25 MG PO TABS
1.0000 | ORAL_TABLET | Freq: Every day | ORAL | 3 refills | Status: DC
  Filled 2021-08-28: qty 90, 90d supply, fill #0
  Filled 2021-11-02: qty 90, 90d supply, fill #1
  Filled 2022-02-08: qty 90, 90d supply, fill #2
  Filled 2022-05-11: qty 90, 90d supply, fill #3

## 2021-08-28 MED ORDER — GABAPENTIN 300 MG PO CAPS
300.0000 mg | ORAL_CAPSULE | Freq: Three times a day (TID) | ORAL | 0 refills | Status: DC
Start: 2021-08-28 — End: 2021-10-26
  Filled 2021-08-28 (×2): qty 270, 90d supply, fill #0

## 2021-08-28 MED ORDER — METOPROLOL SUCCINATE ER 100 MG PO TB24
100.0000 mg | ORAL_TABLET | Freq: Every day | ORAL | 0 refills | Status: DC
  Filled 2021-08-28: qty 90, 90d supply, fill #0

## 2021-09-04 ENCOUNTER — Other Ambulatory Visit (HOSPITAL_COMMUNITY): Payer: Self-pay

## 2021-09-20 ENCOUNTER — Other Ambulatory Visit (HOSPITAL_COMMUNITY): Payer: Self-pay

## 2021-09-20 MED ORDER — PEG 3350-KCL-NA BICARB-NACL 420 G PO SOLR
ORAL | 0 refills | Status: DC
  Filled 2021-09-20: qty 4000, 1d supply, fill #0

## 2021-09-22 ENCOUNTER — Other Ambulatory Visit: Payer: Self-pay | Admitting: Family Medicine

## 2021-09-22 ENCOUNTER — Encounter: Payer: Self-pay | Admitting: Podiatrist

## 2021-09-22 ENCOUNTER — Ambulatory Visit: Payer: 59 | Admitting: Podiatrist

## 2021-09-22 ENCOUNTER — Other Ambulatory Visit (HOSPITAL_COMMUNITY): Payer: Self-pay

## 2021-09-22 DIAGNOSIS — B351 Tinea unguium: Secondary | ICD-10-CM

## 2021-09-22 DIAGNOSIS — E119 Type 2 diabetes mellitus without complications: Secondary | ICD-10-CM

## 2021-09-22 DIAGNOSIS — Z1231 Encounter for screening mammogram for malignant neoplasm of breast: Secondary | ICD-10-CM

## 2021-09-22 DIAGNOSIS — M79609 Pain in unspecified limb: Secondary | ICD-10-CM | POA: Diagnosis not present

## 2021-09-22 DIAGNOSIS — G629 Polyneuropathy, unspecified: Secondary | ICD-10-CM

## 2021-09-22 MED ORDER — UREA 40 % EX CREA
1.0000 | TOPICAL_CREAM | Freq: Two times a day (BID) | CUTANEOUS | 4 refills | Status: DC
  Filled 2021-09-22: qty 28.35, 20d supply, fill #0

## 2021-09-22 NOTE — Progress Notes (Signed)
Chief Complaint  Patient presents with   Foot Problem    Diabetic routine foot care-  BS 97 on Sunday. A1C 6.1 on May 22,23.     Subjective: Misty Maxwell is a 56 y.o. female patient with history of diabetes who presents to office today with a chief concern of long,mildly painful nails  while ambulating in shoes; unable to trim.  She relates that she had a problem with her sciatic nerve which required her to have surgery on her back.  She states that the right side has always been numb and the left side seems okay.  She relates she has swelling in both feet and lower legs which has been present for several years duration.    Alcus Dad, MD  is her primary care provider.  Patient Active Problem List   Diagnosis Date Noted   Neuropathy 06/21/2021   Hyperlipidemia associated with type 2 diabetes mellitus ([place]) 09/05/2019   Status post lumbar spinal fusion 05/21/2019   Degenerative spondylolisthesis 12/30/2018   Type 2 diabetes mellitus without complication, without long-term current use of insulin ([place]) 02/12/2018   OBESITY, MORBID 03/28/2006   GLAUCOMA 03/28/2006   HYPERTENSION, BENIGN SYSTEMIC 03/28/2006   Current Outpatient Medications on File Prior to Visit  Medication Sig Dispense Refill   aspirin 81 MG tablet Take 1 tablet (81 mg total) by mouth daily. 30 tablet 1   bimatoprost (LUMIGAN) 0.01 % SOLN Place 1 drop into both eyes at bedtime. 2.5 mL 4   dorzolamide-timolol (COSOPT) 22.3-6.8 MG/ML ophthalmic solution Place 1 drop into both eyes 2 (two) times daily. 10 mL 2   gabapentin (NEURONTIN) 300 MG capsule Take 1 capsule (300 mg total) by mouth 3 (three) times daily. 270 capsule 0   lisinopril-hydrochlorothiazide (ZESTORETIC) 20-25 MG tablet Take 1 tablet by mouth daily. 90 tablet 3   Melatonin 5 MG CAPS Take 10 mg by mouth at bedtime as needed (sleep).     metFORMIN (GLUCOPHAGE) 500 MG tablet Take 1 tablet (500 mg total) by mouth daily. 90 tablet 3   metoprolol  succinate (TOPROL-XL) 100 MG 24 hr tablet Take 1 tablet (100 mg total) by mouth daily. 90 tablet 0   polyethylene glycol-electrolytes (NULYTELY) 420 g solution Take as directed 4000 mL 0   rosuvastatin (CRESTOR) 20 MG tablet TAKE 1 TABLET (20 MG TOTAL) BY MOUTH DAILY. 90 tablet 3   tirzepatide (MOUNJARO) 7.5 MG/0.5ML Pen Inject 7.5 mg into the skin once a week. 6 mL 0   No current facility-administered medications on file prior to visit.   No Known Allergies    Objective: General: Patient is awake, alert, and oriented x 3 and in no acute distress.  Integument: Skin is warm, dry and supple bilateral. Nails are tender, long, thickened and  dystrophic with subungual debris, consistent with onychomycosis, 1-5 bilateral. No signs of infection. No open lesions or preulcerative lesions present bilateral.    Vasculature:  Dorsalis Pedis pulse 1/4 bilateral. Posterior Tibial pulse  1/4 bilateral.  Capillary fill time <3 sec 1-5 bilateral. Temperature gradient within normal limits. No varicosities present bilateral. Edema bilateral feet ankles and lower legs noted with associated skin changes anterior lower legs.  Neurology: The patient has decreased sensation measured with a 5.07/10g Semmes Weinstein Monofilament at  5/5 pedal sites bilateral . Vibratory sensation intact bilateral with tuning fork. No Babinski sign present bilateral.   Musculoskeletal: No symptomatic pedal deformities noted bilateral. Muscular strength 5/5 in all lower extremity muscular groups bilateral without pain  on range of motion . No tenderness with calf compression bilateral.  Pes planus foot type noted  Assessment and Plan:   ICD-10-CM   1. Type 2 diabetes mellitus without complication, without long-term current use of insulin (HCC)  E11.9     2. Neuropathy  G62.9     3. Pain due to onychomycosis of nail  B35.1    M79.609        -Examined patient. -Mechanically debrided all nails 1-5 bilateral using sterile nail  nipper and filed with dremel without incident  -Answered all patient questions -Advised against home foot care or nonprofessional foot care at a nail salon due to her diabetes and mycotic nails. -Patient to return  in 3 months for at risk foot care -Patient advised to call the office if any problems or questions arise in the meantime.  Bronson Ing, DPM

## 2021-09-22 NOTE — Patient Instructions (Signed)
Diabetes Mellitus and Foot Care Foot care is an important part of your health, especially when you have diabetes. Diabetes may cause you to have problems because of poor blood flow (circulation) to your feet and legs, which can cause your skin to: Become thinner and drier. Break more easily. Heal more slowly. Peel and crack. You may also have nerve damage (neuropathy) in your legs and feet, causing decreased feeling in them. This means that you may not notice minor injuries to your feet that could lead to more serious problems. Noticing and addressing any potential problems early is the best way to prevent future foot problems. How to care for your feet Foot hygiene  Wash your feet daily with warm water and mild soap. Do not use hot water. Then, pat your feet and the areas between your toes until they are completely dry. Do not soak your feet as this can dry your skin. Trim your toenails straight across. Do not dig under them or around the cuticle. File the edges of your nails with an emery board or nail file. Apply a moisturizing lotion or petroleum jelly to the skin on your feet and to dry, brittle toenails. Use lotion that does not contain alcohol and is unscented. Do not apply lotion between your toes. Shoes and socks Wear clean socks or stockings every day. Make sure they are not too tight. Do not wear knee-high stockings since they may decrease blood flow to your legs. Wear shoes that fit properly and have enough cushioning. Always look in your shoes before you put them on to be sure there are no objects inside. To break in new shoes, wear them for just a few hours a day. This prevents injuries on your feet. Wounds, scrapes, corns, and calluses  Check your feet daily for blisters, cuts, bruises, sores, and redness. If you cannot see the bottom of your feet, use a mirror or ask someone for help. Do not cut corns or calluses or try to remove them with medicine. If you find a minor scrape,  cut, or break in the skin on your feet, keep it and the skin around it clean and dry. You may clean these areas with mild soap and water. Do not clean the area with peroxide, alcohol, or iodine. If you have a wound, scrape, corn, or callus on your foot, look at it several times a day to make sure it is healing and not infected. Check for: Redness, swelling, or pain. Fluid or blood. Warmth. Pus or a bad smell. General tips Do not cross your legs. This may decrease blood flow to your feet. Do not use heating pads or hot water bottles on your feet. They may burn your skin. If you have lost feeling in your feet or legs, you may not know this is happening until it is too late. Protect your feet from hot and cold by wearing shoes, such as at the beach or on hot pavement. Schedule a complete foot exam at least once a year (annually) or more often if you have foot problems. Report any cuts, sores, or bruises to your health care provider immediately. Where to find more information American Diabetes Association: www.diabetes.org Association of Diabetes Care & Education Specialists: www.diabeteseducator.org Contact a health care provider if: You have a medical condition that increases your risk of infection and you have any cuts, sores, or bruises on your feet. You have an injury that is not healing. You have redness on your legs or feet. You   feel burning or tingling in your legs or feet. You have pain or cramps in your legs and feet. Your legs or feet are numb. Your feet always feel cold. You have pain around any toenails. Get help right away if: You have a wound, scrape, corn, or callus on your foot and: You have pain, swelling, or redness that gets worse. You have fluid or blood coming from the wound, scrape, corn, or callus. Your wound, scrape, corn, or callus feels warm to the touch. You have pus or a bad smell coming from the wound, scrape, corn, or callus. You have a fever. You have a red  line going up your leg. Summary Check your feet every day for blisters, cuts, bruises, sores, and redness. Apply a moisturizing lotion or petroleum jelly to the skin on your feet and to dry, brittle toenails. Wear shoes that fit properly and have enough cushioning. If you have foot problems, report any cuts, sores, or bruises to your health care provider immediately. Schedule a complete foot exam at least once a year (annually) or more often if you have foot problems. This information is not intended to replace advice given to you by your health care provider. Make sure you discuss any questions you have with your health care provider. Document Revised: 08/06/2019 Document Reviewed: 08/06/2019 Elsevier Patient Education  2023 Elsevier Inc.  

## 2021-09-25 ENCOUNTER — Other Ambulatory Visit (HOSPITAL_COMMUNITY): Payer: Self-pay

## 2021-09-25 DIAGNOSIS — K648 Other hemorrhoids: Secondary | ICD-10-CM | POA: Diagnosis not present

## 2021-09-25 DIAGNOSIS — K644 Residual hemorrhoidal skin tags: Secondary | ICD-10-CM | POA: Diagnosis not present

## 2021-09-25 DIAGNOSIS — Z09 Encounter for follow-up examination after completed treatment for conditions other than malignant neoplasm: Secondary | ICD-10-CM | POA: Diagnosis not present

## 2021-09-25 DIAGNOSIS — D123 Benign neoplasm of transverse colon: Secondary | ICD-10-CM | POA: Diagnosis not present

## 2021-09-25 DIAGNOSIS — Z86010 Personal history of colonic polyps: Secondary | ICD-10-CM | POA: Diagnosis not present

## 2021-09-26 ENCOUNTER — Other Ambulatory Visit: Payer: Self-pay | Admitting: Family Medicine

## 2021-09-26 DIAGNOSIS — E119 Type 2 diabetes mellitus without complications: Secondary | ICD-10-CM

## 2021-09-27 MED ORDER — MOUNJARO 7.5 MG/0.5ML ~~LOC~~ SOAJ
7.5000 mg | SUBCUTANEOUS | 0 refills | Status: DC
  Filled 2021-09-27: qty 6, 84d supply, fill #0

## 2021-09-28 ENCOUNTER — Other Ambulatory Visit (HOSPITAL_COMMUNITY): Payer: Self-pay

## 2021-09-29 ENCOUNTER — Other Ambulatory Visit (HOSPITAL_COMMUNITY): Payer: Self-pay

## 2021-10-12 ENCOUNTER — Ambulatory Visit
Admission: RE | Admit: 2021-10-12 | Discharge: 2021-10-12 | Disposition: A | Discharge status: Home / Self Care | Payer: 59 | Source: Ambulatory Visit | Attending: Family Medicine | Admitting: Family Medicine

## 2021-10-12 DIAGNOSIS — Z1231 Encounter for screening mammogram for malignant neoplasm of breast: Secondary | ICD-10-CM

## 2021-10-26 ENCOUNTER — Ambulatory Visit: Payer: 59 | Admitting: Family Medicine

## 2021-10-26 ENCOUNTER — Encounter: Payer: Self-pay | Admitting: Family Medicine

## 2021-10-26 VITALS — BP 110/82 | HR 83 | Wt 302.0 lb

## 2021-10-26 DIAGNOSIS — E119 Type 2 diabetes mellitus without complications: Secondary | ICD-10-CM | POA: Diagnosis not present

## 2021-10-26 DIAGNOSIS — I10 Essential (primary) hypertension: Secondary | ICD-10-CM | POA: Diagnosis not present

## 2021-10-26 LAB — POCT GLYCOSYLATED HEMOGLOBIN (HGB A1C): HbA1c, POC (controlled diabetic range): 5.6 % (ref 0.0–7.0)

## 2021-10-26 MED ORDER — METOPROLOL SUCCINATE ER 50 MG PO TB24: 50.0000 mg | ORAL_TABLET | Freq: Every day | ORAL | Status: DC

## 2021-10-26 NOTE — Assessment & Plan Note (Signed)
Very well controlled. A1c 5.6% today. Continue current medications (Metformin '500mg'$  daily, Mounjaro 7.'5mg'$  weekly). Obtain updated BMP today and urine microalbumin. On statin and ACEi. UTD on foot and eye exams. Next A1c 6 months.

## 2021-10-26 NOTE — Assessment & Plan Note (Signed)
BP very well controlled. Patient wishes to reduce anti-hypertensive medications if possible in light of her recent weight loss. I agree this is a reasonable plan. Continue lisinopril-HCTZ 20-'25mg'$  daily. Reduce Metoprolol from '100mg'$  daily to '50mg'$  daily. Patient checks BP at home and will let me know if readings increase.

## 2021-10-26 NOTE — Patient Instructions (Addendum)
It was great to see you!  Your A1c was 5.6% today, which means your diabetes is very well controlled. Your goal A1c is below 7%. Below 5.7% is technically in the normal (non-diabetic) range. Continue your current diabetes medications with no changes. We will repeat your A1c in 6 months.   We will reduce your metoprolol to '50mg'$  daily. I will send a new prescription.  Keep up the great work with the weight loss! We still have more work to do in that department.   We are checking some labs. I will send you a Mychart message with the results.  I'm proud of you!  Take care, Dr Rock Nephew

## 2021-10-26 NOTE — Assessment & Plan Note (Signed)
Improving from prior. Weight down 20 lbs from last visit 5 months ago, BMI now 45 (from 8 previously). Congratulated patient. Encouraged continued weight loss. Continue Mounjaro 7.'5mg'$  weekly as above for diabetes.

## 2021-10-26 NOTE — Progress Notes (Signed)
    SUBJECTIVE:   CHIEF COMPLAINT / HPI:   Type 2 Diabetes Patient is a 56 y.o. female who presents today for diabetes follow-up.  Home medications include: metformin '500mg'$  daily, mounjaro 7.'5mg'$  weekly Patient reports excellent medication compliance with no missed doses. Patient checks sugar at home. Typically range 80-110s No hypoglycemic episodes/symptoms  Most recent A1Cs:  Lab Results  Component Value Date   HGBA1C 5.6 10/26/2021   HGBA1C 6.1 06/20/2021   HGBA1C 6.9 03/20/2021   Last Microalbumin, LDL, Creatinine: Lab Results  Component Value Date   MICROALBUR 10 05/07/2019   LDLCALC 73 03/20/2021   CREATININE 1.07 (H) 06/20/2021    Patient is up to date on diabetic eye. Patient is up to date on diabetic foot exam.  PERTINENT  PMH / PSH: obesity, HTN, HLD  OBJECTIVE:   BP 110/82   Pulse 83   Wt (!) 302 lb (137 kg)   LMP 09/29/2010   SpO2 97%   BMI 45.92 kg/m   Gen: NAD, pleasant, able to participate in exam CV: RRR, normal S1/S2, no murmur Resp: Normal effort, lungs CTAB Extremities: no edema or cyanosis Skin: warm and dry, no rashes noted Neuro: alert, no obvious focal deficits Psych: Normal affect and mood   ASSESSMENT/PLAN:   Type 2 diabetes mellitus without complication, without long-term current use of insulin ([place]) Very well controlled. A1c 5.6% today. Continue current medications (Metformin '500mg'$  daily, Mounjaro 7.'5mg'$  weekly). Obtain updated BMP today and urine microalbumin. On statin and ACEi. UTD on foot and eye exams. Next A1c 6 months.  OBESITY, MORBID Improving from prior. Weight down 20 lbs from last visit 5 months ago, BMI now 45 (from 101 previously). Congratulated patient. Encouraged continued weight loss. Continue Mounjaro 7.'5mg'$  weekly as above for diabetes.  HYPERTENSION, BENIGN SYSTEMIC BP very well controlled. Patient wishes to reduce anti-hypertensive medications if possible in light of her recent weight loss. I agree this is a  reasonable plan. Continue lisinopril-HCTZ 20-'25mg'$  daily. Reduce Metoprolol from '100mg'$  daily to '50mg'$  daily. Patient checks BP at home and will let me know if readings increase.     Alcus Dad, MD [place]

## 2021-10-27 LAB — BASIC METABOLIC PANEL
BUN/Creatinine Ratio: 22 (ref 9–23)
BUN: 17 mg/dL (ref 6–24)
CO2: 23 mmol/L (ref 20–29)
Calcium: 10.3 mg/dL — ABNORMAL HIGH (ref 8.7–10.2)
Chloride: 100 mmol/L (ref 96–106)
Creatinine, Ser: 0.77 mg/dL (ref 0.57–1.00)
Glucose: 83 mg/dL (ref 70–99)
Potassium: 4.2 mmol/L (ref 3.5–5.2)
Sodium: 139 mmol/L (ref 134–144)
eGFR: 90 mL/min/{1.73_m2} (ref >59)

## 2021-10-27 LAB — MICROALBUMIN / CREATININE URINE RATIO
Creatinine, Urine: 104.7 mg/dL
Microalb/Creat Ratio: 10 mg/g creat (ref 0–29)
Microalbumin, Urine: 10.2 ug/mL

## 2021-11-02 ENCOUNTER — Other Ambulatory Visit (HOSPITAL_COMMUNITY): Payer: Self-pay

## 2021-11-03 ENCOUNTER — Other Ambulatory Visit (HOSPITAL_COMMUNITY): Payer: Self-pay

## 2021-11-06 ENCOUNTER — Other Ambulatory Visit (HOSPITAL_COMMUNITY): Payer: Self-pay

## 2021-11-07 ENCOUNTER — Other Ambulatory Visit (HOSPITAL_COMMUNITY): Payer: Self-pay

## 2021-11-09 ENCOUNTER — Other Ambulatory Visit (HOSPITAL_COMMUNITY): Payer: Self-pay

## 2021-12-18 ENCOUNTER — Encounter: Payer: Self-pay | Admitting: Family Medicine

## 2021-12-18 ENCOUNTER — Other Ambulatory Visit (HOSPITAL_COMMUNITY): Payer: Self-pay

## 2021-12-18 MED ORDER — TIRZEPATIDE 10 MG/0.5ML ~~LOC~~ SOAJ
10.0000 mg | SUBCUTANEOUS | 2 refills | Status: DC
  Filled 2021-12-18: qty 2, 28d supply, fill #0
  Filled 2022-01-17: qty 2, 28d supply, fill #1
  Filled 2022-02-14: qty 2, 28d supply, fill #2

## 2022-01-01 ENCOUNTER — Encounter: Payer: Self-pay | Admitting: Family Medicine

## 2022-01-01 ENCOUNTER — Other Ambulatory Visit (HOSPITAL_COMMUNITY): Payer: Self-pay

## 2022-01-01 DIAGNOSIS — I10 Essential (primary) hypertension: Secondary | ICD-10-CM

## 2022-01-01 MED ORDER — LIDOCAINE 5 % EX CREA
TOPICAL_CREAM | CUTANEOUS | 5 refills | Status: AC
  Filled 2022-01-01: qty 30, 30d supply, fill #0

## 2022-01-02 ENCOUNTER — Other Ambulatory Visit (HOSPITAL_BASED_OUTPATIENT_CLINIC_OR_DEPARTMENT_OTHER): Payer: Self-pay

## 2022-01-02 ENCOUNTER — Other Ambulatory Visit (HOSPITAL_COMMUNITY): Payer: Self-pay

## 2022-01-02 MED ORDER — METOPROLOL SUCCINATE ER 50 MG PO TB24
50.0000 mg | ORAL_TABLET | Freq: Every day | ORAL | 1 refills | Status: DC
  Filled 2022-01-02: qty 90, 90d supply, fill #0
  Filled 2022-02-14 – 2022-04-04 (×2): qty 90, 90d supply, fill #1

## 2022-01-02 NOTE — Addendum Note (Signed)
Addended by: Alcus Dad on: 01/02/2022 08:31 AM   Modules accepted: Orders

## 2022-01-18 ENCOUNTER — Other Ambulatory Visit (HOSPITAL_COMMUNITY): Payer: Self-pay

## 2022-02-14 ENCOUNTER — Other Ambulatory Visit (HOSPITAL_COMMUNITY): Payer: Self-pay

## 2022-02-14 ENCOUNTER — Other Ambulatory Visit: Payer: Self-pay

## 2022-03-01 DIAGNOSIS — H401131 Primary open-angle glaucoma, bilateral, mild stage: Secondary | ICD-10-CM | POA: Diagnosis not present

## 2022-03-01 DIAGNOSIS — H2513 Age-related nuclear cataract, bilateral: Secondary | ICD-10-CM | POA: Diagnosis not present

## 2022-03-19 ENCOUNTER — Other Ambulatory Visit (HOSPITAL_COMMUNITY): Payer: Self-pay

## 2022-03-19 ENCOUNTER — Other Ambulatory Visit: Payer: Self-pay | Admitting: Family Medicine

## 2022-03-19 MED ORDER — MOUNJARO 10 MG/0.5ML ~~LOC~~ SOAJ
10.0000 mg | SUBCUTANEOUS | 2 refills | Status: DC
  Filled 2022-03-19: qty 2, 28d supply, fill #0
  Filled 2022-04-12: qty 2, 28d supply, fill #1

## 2022-03-20 ENCOUNTER — Other Ambulatory Visit (HOSPITAL_COMMUNITY): Payer: Self-pay

## 2022-04-04 ENCOUNTER — Other Ambulatory Visit: Payer: Self-pay | Admitting: Family Medicine

## 2022-04-04 ENCOUNTER — Other Ambulatory Visit: Payer: Self-pay

## 2022-04-05 ENCOUNTER — Other Ambulatory Visit (HOSPITAL_COMMUNITY): Payer: Self-pay

## 2022-04-05 MED ORDER — METFORMIN HCL 500 MG PO TABS
500.0000 mg | ORAL_TABLET | Freq: Every day | ORAL | 3 refills | Status: DC
  Filled 2022-04-05: qty 90, 90d supply, fill #0

## 2022-04-18 ENCOUNTER — Other Ambulatory Visit (HOSPITAL_COMMUNITY): Payer: Self-pay

## 2022-05-01 ENCOUNTER — Ambulatory Visit: Payer: Commercial Managed Care - PPO | Admitting: Family Medicine

## 2022-05-01 ENCOUNTER — Other Ambulatory Visit (HOSPITAL_COMMUNITY): Payer: Self-pay

## 2022-05-01 ENCOUNTER — Encounter: Payer: Self-pay | Admitting: Family Medicine

## 2022-05-01 ENCOUNTER — Other Ambulatory Visit: Payer: Self-pay

## 2022-05-01 VITALS — BP 122/80 | HR 73 | Ht 68.0 in | Wt 295.8 lb

## 2022-05-01 DIAGNOSIS — E119 Type 2 diabetes mellitus without complications: Secondary | ICD-10-CM

## 2022-05-01 LAB — POCT GLYCOSYLATED HEMOGLOBIN (HGB A1C): Hemoglobin A1C: 5.6 % (ref 4.0–5.6)

## 2022-05-01 MED ORDER — TIRZEPATIDE 12.5 MG/0.5ML ~~LOC~~ SOAJ
12.5000 mg | SUBCUTANEOUS | 1 refills | Status: DC
  Filled 2022-05-01: qty 6, 84d supply, fill #0
  Filled 2022-05-07: qty 2, 28d supply, fill #0
  Filled 2022-06-10: qty 2, 28d supply, fill #1
  Filled 2022-07-04: qty 2, 28d supply, fill #2
  Filled 2022-08-01: qty 2, 28d supply, fill #3
  Filled 2022-08-29: qty 2, 28d supply, fill #4

## 2022-05-01 NOTE — Assessment & Plan Note (Signed)
Very well-controlled. A1c 5.6%. -Stop metformin -Increase Mounjaro to 12.5mg  weekly -Continue ACE, statin -UTD on eye exam, she will have eye doctor fax notes to [place] -UTD on foot exam -Repeat A1c in 6 months. Check urine microalbumin, BMP , and updated lipid panel at that time.

## 2022-05-01 NOTE — Progress Notes (Signed)
    SUBJECTIVE:   CHIEF COMPLAINT / HPI:   Type 2 Diabetes Patient is a 57 y.o. female who presents today for diabetes follow-up.  Home medications include: metformin 500mg  daily, mounjaro 10mg  weekly. Would like to try higher dose of Mounjaro. Patient reports excellent medication compliance. Patient does check sugar at home once weekly. Typically range 70-110s. No hypoglycemic episodes/symptoms  Most recent A1Cs:  Lab Results  Component Value Date   HGBA1C 5.6 05/01/2022   HGBA1C 5.6 10/26/2021   HGBA1C 6.1 06/20/2021   Last Microalbumin, LDL, Creatinine: Lab Results  Component Value Date   MICROALBUR 10 05/07/2019   [place] 73 03/20/2021   CREATININE 0.77 10/26/2021   Patient is up to date on diabetic eye- had appointment in February 2024, has next appointment in July. Patient is up to date on diabetic foot exam.   PERTINENT  PMH / PSH: HTN, HLD  OBJECTIVE:   BP 122/80   Pulse 73   Ht 5\' 8"  (1.727 m)   Wt 295 lb 12.8 oz (134.2 kg)   LMP 09/29/2010   SpO2 100%   BMI 44.98 kg/m   Gen: NAD, pleasant, able to participate in exam CV: RRR, normal S1/S2, no murmur Resp: Normal effort, lungs CTAB Extremities: no edema or cyanosis Skin: warm and dry, no rashes noted Neuro: alert, no obvious focal deficits Psych: Normal affect and mood   ASSESSMENT/PLAN:   Type 2 diabetes mellitus without complication, without long-term current use of insulin ([place]) Very well-controlled. A1c 5.6%. -Stop metformin -Increase Mounjaro to 12.5mg  weekly -Continue ACE, statin -UTD on eye exam, she will have eye doctor fax notes to [place] -UTD on foot exam -Repeat A1c in 6 months. Check urine microalbumin, BMP , and updated lipid panel at that time.    Alcus Dad, MD [place]

## 2022-05-01 NOTE — Patient Instructions (Addendum)
It was great to see you!  Your A1c was 5.6% today, which means your diabetes is very well-controlled. Continue your current medications with the following changes:  STOP your Metformin. We will increase your Mounjaro to 12.5mg  weekly. I sent a new prescription to your pharmacy.  Continue checking your sugar and blood pressure from time to time. Let me know if you are having any lows or highs.  Ask your eye doctor to re-send the visit notes to our office.  Follow up in 5-6 months for your annual physical/next A1c and blood work.  It was an absolute pleasure taking care of you! I will miss seeing you. Keep up the GREAT work with the weight loss and diabetes control. I'm here through June if you need anything.  Take care, Dr Rock Nephew

## 2022-05-07 ENCOUNTER — Other Ambulatory Visit: Payer: Self-pay

## 2022-05-07 ENCOUNTER — Other Ambulatory Visit: Payer: Self-pay | Admitting: Family Medicine

## 2022-05-07 ENCOUNTER — Other Ambulatory Visit (HOSPITAL_COMMUNITY): Payer: Self-pay

## 2022-05-07 MED ORDER — ROSUVASTATIN CALCIUM 20 MG PO TABS
20.0000 mg | ORAL_TABLET | Freq: Every day | ORAL | 3 refills | Status: DC
  Filled 2022-05-07: qty 90, 90d supply, fill #0
  Filled 2022-08-01: qty 90, 90d supply, fill #1
  Filled 2022-11-01: qty 90, 90d supply, fill #2
  Filled 2023-01-27: qty 90, 90d supply, fill #3

## 2022-05-09 ENCOUNTER — Other Ambulatory Visit: Payer: Self-pay

## 2022-05-09 ENCOUNTER — Other Ambulatory Visit (HOSPITAL_COMMUNITY): Payer: Self-pay

## 2022-05-11 ENCOUNTER — Other Ambulatory Visit (HOSPITAL_COMMUNITY): Payer: Self-pay

## 2022-07-02 DIAGNOSIS — H401131 Primary open-angle glaucoma, bilateral, mild stage: Secondary | ICD-10-CM | POA: Diagnosis not present

## 2022-07-04 ENCOUNTER — Other Ambulatory Visit: Payer: Self-pay | Admitting: Family Medicine

## 2022-07-04 DIAGNOSIS — I10 Essential (primary) hypertension: Secondary | ICD-10-CM

## 2022-07-05 ENCOUNTER — Other Ambulatory Visit: Payer: Self-pay

## 2022-07-05 ENCOUNTER — Other Ambulatory Visit (HOSPITAL_COMMUNITY): Payer: Self-pay

## 2022-07-05 MED ORDER — METOPROLOL SUCCINATE ER 50 MG PO TB24
50.0000 mg | ORAL_TABLET | Freq: Every day | ORAL | 1 refills | Status: DC
  Filled 2022-07-05: qty 90, 90d supply, fill #0

## 2022-08-02 ENCOUNTER — Other Ambulatory Visit: Payer: Self-pay | Admitting: Family Medicine

## 2022-08-02 DIAGNOSIS — I10 Essential (primary) hypertension: Secondary | ICD-10-CM

## 2022-08-03 ENCOUNTER — Other Ambulatory Visit (HOSPITAL_COMMUNITY): Payer: Self-pay

## 2022-08-03 MED ORDER — LISINOPRIL-HYDROCHLOROTHIAZIDE 20-25 MG PO TABS
1.0000 | ORAL_TABLET | Freq: Every day | ORAL | 0 refills | Status: DC
Start: 2022-08-03 — End: 2022-09-13
  Filled 2022-08-03: qty 90, 90d supply, fill #0

## 2022-08-03 NOTE — Addendum Note (Signed)
Addended by: Celine Mans on: 08/03/2022 09:52 AM   Modules accepted: Orders

## 2022-09-13 ENCOUNTER — Encounter: Payer: Self-pay | Admitting: Family Medicine

## 2022-09-13 ENCOUNTER — Ambulatory Visit (INDEPENDENT_AMBULATORY_CARE_PROVIDER_SITE_OTHER): Payer: Commercial Managed Care - PPO | Admitting: Family Medicine

## 2022-09-13 ENCOUNTER — Other Ambulatory Visit (HOSPITAL_COMMUNITY): Payer: Self-pay

## 2022-09-13 VITALS — BP 122/66 | HR 67 | Ht 68.0 in | Wt 285.6 lb

## 2022-09-13 DIAGNOSIS — I10 Essential (primary) hypertension: Secondary | ICD-10-CM

## 2022-09-13 DIAGNOSIS — Z0000 Encounter for general adult medical examination without abnormal findings: Secondary | ICD-10-CM

## 2022-09-13 DIAGNOSIS — E119 Type 2 diabetes mellitus without complications: Secondary | ICD-10-CM | POA: Diagnosis not present

## 2022-09-13 LAB — POCT GLYCOSYLATED HEMOGLOBIN (HGB A1C): HbA1c POC (<> result, manual entry): 5.6 % (ref 4.0–5.6)

## 2022-09-13 MED ORDER — LISINOPRIL-HYDROCHLOROTHIAZIDE 20-25 MG PO TABS
1.0000 | ORAL_TABLET | Freq: Every day | ORAL | 0 refills | Status: DC
Start: 2022-09-13 — End: 2023-01-27
  Filled 2022-09-13 – 2022-11-01 (×2): qty 90, 90d supply, fill #0

## 2022-09-13 NOTE — Patient Instructions (Addendum)
It was great to see you! Thank you for allowing me to participate in your care!  Our plans for today:  - Your goal blood pressure is less than 130/80.  Check your blood pressure daily and bring in at your follow-up appointment next in 2 week.  If regularly higher than this please let me know - either with MyChart or leaving a phone message. - Shoot me a message on mychart when you need more Mounjaro and I will increase your prescription.  - STOP taking aspirin.    Please arrive 15 minutes PRIOR to your next scheduled appointment time! If you do not, this affects OTHER patients' care.  Take care and seek immediate care sooner if you develop any concerns.   Celine Mans, MD, PGY-2 [facility] 2:45 PM 09/13/2022  [facility]

## 2022-09-13 NOTE — Assessment & Plan Note (Signed)
A1c today 5.6, previously 0.6.  Patient wishes to increase Mounjaro for weight loss benefit.  This is reasonable.  Diabetes is well-controlled.  Patient will request dose increase when she runs out of current prescription.  Plan to increase to max of 50 mg weekly.

## 2022-09-13 NOTE — Progress Notes (Signed)
    SUBJECTIVE:   Chief compliant/HPI: annual examination  Misty Maxwell is a 57 y.o. who presents today for an annual exam.   Hypertension: - Taking Metoprolol and Lisinopril-hydrochlorothiazide. Measuring at home. 120s/70s at home. No dizziness or lightheaded.  Patient would like to stop metoprolol as it has been well-controlled.   T2DM: - Sugars at home 90s-125s. Taking Mounjaro. Lost 10lbs in 3 months. Would like to increase dose.  Taking Aspirin for years. States has not had heart attack or stroke. No blood clots.  History tabs reviewed and updated.    OBJECTIVE:   BP 122/66   Pulse 67   Ht 5\' 8"  (1.727 m)   Wt 285 lb 9.6 oz (129.5 kg)   LMP 09/29/2010   SpO2 100%   BMI 43.43 kg/m   General: A&O, NAD, well-appearing HEENT: No sign of trauma, EOM grossly intact, moist mucous membranes Cardiac: RRR, no m/r/g Respiratory: CTAB, normal WOB, no w/c/r GI: Soft, NTTP, non-distended, no rebound or guarding Extremities: NTTP, no peripheral edema. Neuro: Moves all four extremities appropriately. Psych: Appropriate mood and affect   ASSESSMENT/PLAN:   Annual physical exam  Type 2 diabetes mellitus without complication, without long-term current use of insulin (HCC) Assessment & Plan: A1c today 5.6, previously 0.6.  Patient wishes to increase Mounjaro for weight loss benefit.  This is reasonable.  Diabetes is well-controlled.  Patient will request dose increase when she runs out of current prescription.  Plan to increase to max of 50 mg weekly.  Orders: -     POCT glycosylated hemoglobin (Hb A1C) -     Lipid panel -     Basic metabolic panel  Essential hypertension Assessment & Plan: Patient's blood pressure is controlled today. BP: 122/66. Goal of 130/80. Patient's medication regimen includes Zestoretic, metoprolol. -Changes to current regimen include: Continue Lisoretic and metoprolol.  Discussed with patient to report daily over the next 2 weeks and bring  in at follow-up.  If blood pressures are controlled and given weight loss over past year will trial stopping metoprolol. -Labs: BMP -Follow up in 2 weeks   Orders: -     Lisinopril-hydroCHLOROthiazide; Take 1 tablet by mouth daily. Please schedule appt before next refill.  Dispense: 90 tablet; Refill: 0 -     Basic metabolic panel     Annual Examination  See AVS for age appropriate recommendations.  PHQ score 0, reviewed and discussed.  Blood pressure value is at goal, discussed.   Considered the following screening exams based upon USPSTF recommendations: Diabetes screening: discussed and ordered Screening for elevated cholesterol: discussed and ordered HIV testing: discussed Hepatitis C: discussed Hepatitis B: discussed Syphilis if at high risk: discussed Reviewed risk factors for latent tuberculosis and not indicated Colorectal cancer screening: up to date on screening for CRC. Lung cancer screening: discussed See documentation below regarding discussion and indication.    Follow up in 1 year or sooner if indicated.    Celine Mans, MD [facility]

## 2022-09-13 NOTE — Assessment & Plan Note (Signed)
Patient's blood pressure is controlled today. BP: 122/66. Goal of 130/80. Patient's medication regimen includes Zestoretic, metoprolol. -Changes to current regimen include: Continue Lisoretic and metoprolol.  Discussed with patient to report daily over the next 2 weeks and bring in at follow-up.  If blood pressures are controlled and given weight loss over past year will trial stopping metoprolol. -Labs: BMP -Follow up in 2 weeks

## 2022-09-14 LAB — BASIC METABOLIC PANEL
BUN/Creatinine Ratio: 19 (ref 9–23)
BUN: 16 mg/dL (ref 6–24)
CO2: 22 mmol/L (ref 20–29)
Calcium: 10 mg/dL (ref 8.7–10.2)
Chloride: 99 mmol/L (ref 96–106)
Creatinine, Ser: 0.85 mg/dL (ref 0.57–1.00)
Glucose: 70 mg/dL (ref 70–99)
Potassium: 4.3 mmol/L (ref 3.5–5.2)
Sodium: 138 mmol/L (ref 134–144)
eGFR: 80 mL/min/{1.73_m2} (ref >59)

## 2022-09-14 LAB — LIPID PANEL
Chol/HDL Ratio: 2.6 ratio (ref 0.0–4.4)
Cholesterol, Total: 167 mg/dL (ref 100–199)
HDL: 64 mg/dL (ref >39)
LDL Chol Calc (NIH): 90 mg/dL (ref 0–99)
Triglycerides: 65 mg/dL (ref 0–149)
VLDL Cholesterol Cal: 13 mg/dL (ref 5–40)

## 2022-09-24 ENCOUNTER — Encounter: Payer: Self-pay | Admitting: Family Medicine

## 2022-09-24 ENCOUNTER — Other Ambulatory Visit: Payer: Self-pay | Admitting: Family Medicine

## 2022-09-24 DIAGNOSIS — E119 Type 2 diabetes mellitus without complications: Secondary | ICD-10-CM

## 2022-09-24 DIAGNOSIS — Z1231 Encounter for screening mammogram for malignant neoplasm of breast: Secondary | ICD-10-CM

## 2022-09-24 MED ORDER — MOUNJARO 15 MG/0.5ML ~~LOC~~ SOAJ
15.0000 mg | SUBCUTANEOUS | 1 refills | Status: DC
Start: 2022-09-24 — End: 2023-01-18
  Filled 2022-09-24: qty 2, 28d supply, fill #0
  Filled 2022-10-28: qty 2, 28d supply, fill #1
  Filled 2022-11-22: qty 2, 28d supply, fill #2
  Filled 2022-12-20: qty 2, 28d supply, fill #3

## 2022-09-25 ENCOUNTER — Other Ambulatory Visit: Payer: Self-pay

## 2022-09-25 ENCOUNTER — Other Ambulatory Visit (HOSPITAL_COMMUNITY): Payer: Self-pay

## 2022-09-27 ENCOUNTER — Ambulatory Visit: Payer: Commercial Managed Care - PPO | Admitting: Family Medicine

## 2022-09-27 ENCOUNTER — Encounter: Payer: Self-pay | Admitting: Family Medicine

## 2022-09-27 VITALS — BP 132/81 | HR 75 | Ht 68.0 in | Wt 287.5 lb

## 2022-09-27 DIAGNOSIS — I10 Essential (primary) hypertension: Secondary | ICD-10-CM

## 2022-09-27 DIAGNOSIS — E119 Type 2 diabetes mellitus without complications: Secondary | ICD-10-CM

## 2022-09-27 NOTE — Assessment & Plan Note (Signed)
Pt is currently managed on Monjaro; she reports she is finishing up 12.5 mg dose before starting 15 mg dose on 9/8. She reports seeing ophthalmologist in July; she will have their office fax [place] the paperwork. - Continue current medication - Ophthalmologist office to fax [place] paperwork

## 2022-09-27 NOTE — Progress Notes (Signed)
    SUBJECTIVE:   CHIEF COMPLAINT / HPI:   Misty Maxwell is a 57 y.o. female who presents today for BP follow-up.  She reports she is doing well overall with no concerns. No lightheadedness, dizziness, palpitations. She has been keeping a BP log and reports normal home readings in the high 100s-110s/70s. She also reports double-checking her home cuff against one in [employer] to make sure it is accurate.  She reports she has received her higher dose of Monjaro at 15 though will not start this until 9/8 as she is finishing up her 12.5 current dose first. She reports recent ophthalmologist visit in 07/2022.  PERTINENT  PMH / PSH: HTN, DM2, HLD  OBJECTIVE:   BP 132/81   Pulse 75   Ht 5\' 8"  (1.727 m)   Wt 287 lb 8 oz (130.4 kg)   LMP 09/29/2010   BMI 43.71 kg/m   General: Pt is seated in chair, no acute distress. Pulmonary: Normal work of breathing. Neuro/Psych: Alert and oriented to person, place, event. (Time not formerly assessed.) Normal affect.  ASSESSMENT/PLAN:   Essential hypertension BP today is 132/81; pt reports home readings in high 100s-110s/70s. Pt is currently taking metoprolol 50 mg daily, lisinopril-hydrochlorothiazide 20-25 mg daily. She is hoping to stop her metoprolol; given her very good home readings, I am comfortable with this plan. Pt will taper to 25 mg daily for a week before stopping; she will continue recording daily BP readings and inform the office if her readings elevate. - Reduce metoprolol to 25 mg daily for a week, then stop - Continue recording daily BP for next two weeks and inform office if readings elevate - Will arrange for repeat visit in a month and can consider further reducing BP medications  Type 2 diabetes mellitus without complication, without long-term current use of insulin (HCC) Pt is currently managed on Monjaro; she reports she is finishing up 12.5 mg dose before starting 15 mg dose on 9/8. She reports seeing ophthalmologist in  July; she will have their office fax [place] the paperwork. - Continue current medication - Ophthalmologist office to fax [place] paperwork    Governor Rooks, Medical Student [facility]  I was personally present and performed or re-performed the history, physical exam and medical decision making activities of this service and have verified that the service and findings are accurately documented in the student/resident's note. I have made edits and changes where appropriate, and agree with plan.  Celine Mans, MD                  09/27/2022, 4:53 PM

## 2022-09-27 NOTE — Patient Instructions (Addendum)
It was great to see you! Thank you for allowing me to participate in your care!  Our plans for today:  - Please half your metoprolol dose to 25mg  for 1 week and then stop taking, please continue to measure your blood pressure at home. - Please message me in myChart what your blood pressure is over the next 2 weeks.  - Continue to take your Zestoretic - Please have your eye doctor send over your eye exam records.   Please arrive 15 minutes PRIOR to your next scheduled appointment time! If you do not, this affects OTHER patients' care.  Take care and seek immediate care sooner if you develop any concerns.   Celine Mans, MD, PGY-2 [facility] 1:55 PM 09/27/2022  [facility]

## 2022-09-27 NOTE — Assessment & Plan Note (Signed)
BP today is 132/81; pt reports home readings in high 100s-110s/70s. Pt is currently taking metoprolol 50 mg daily, lisinopril-hydrochlorothiazide 20-25 mg daily. She is hoping to stop her metoprolol; given her very good home readings, I am comfortable with this plan. Pt will taper to 25 mg daily for a week before stopping; she will continue recording daily BP readings and inform the office if her readings elevate. - Reduce metoprolol to 25 mg daily for a week, then stop - Continue recording daily BP for next two weeks and inform office if readings elevate - Will arrange for repeat visit in a month and can consider further reducing BP medications

## 2022-09-27 NOTE — Progress Notes (Deleted)
    SUBJECTIVE:   CHIEF COMPLAINT / HPI: bp f/u  ***  PERTINENT  PMH / PSH: HTN, T2DM, HLD  OBJECTIVE:   LMP 09/29/2010   ***  ASSESSMENT/PLAN:   There are no diagnoses linked to this encounter. No follow-ups on file.  Celine Mans, MD [facility]

## 2022-10-16 ENCOUNTER — Ambulatory Visit
Admission: RE | Admit: 2022-10-16 | Discharge: 2022-10-16 | Disposition: A | Discharge status: Home / Self Care | Payer: Commercial Managed Care - PPO | Source: Ambulatory Visit | Attending: Family Medicine | Admitting: Family Medicine

## 2022-10-16 DIAGNOSIS — Z1231 Encounter for screening mammogram for malignant neoplasm of breast: Secondary | ICD-10-CM

## 2022-10-29 ENCOUNTER — Other Ambulatory Visit: Payer: Self-pay

## 2022-10-29 ENCOUNTER — Other Ambulatory Visit (HOSPITAL_COMMUNITY): Payer: Self-pay

## 2022-11-02 ENCOUNTER — Other Ambulatory Visit (HOSPITAL_COMMUNITY): Payer: Self-pay

## 2022-12-20 ENCOUNTER — Other Ambulatory Visit (HOSPITAL_COMMUNITY): Payer: Self-pay

## 2022-12-20 ENCOUNTER — Other Ambulatory Visit: Payer: Self-pay

## 2023-01-18 ENCOUNTER — Other Ambulatory Visit: Payer: Self-pay | Admitting: Family Medicine

## 2023-01-18 ENCOUNTER — Other Ambulatory Visit (HOSPITAL_COMMUNITY): Payer: Self-pay

## 2023-01-18 DIAGNOSIS — E119 Type 2 diabetes mellitus without complications: Secondary | ICD-10-CM

## 2023-01-18 MED ORDER — MOUNJARO 15 MG/0.5ML ~~LOC~~ SOAJ
15.0000 mg | SUBCUTANEOUS | 1 refills | Status: DC
  Filled 2023-01-18: qty 2, 28d supply, fill #0
  Filled 2023-02-13: qty 2, 28d supply, fill #1
  Filled 2023-03-26 (×2): qty 2, 28d supply, fill #2
  Filled 2023-04-22: qty 2, 28d supply, fill #3

## 2023-01-27 ENCOUNTER — Other Ambulatory Visit: Payer: Self-pay | Admitting: Family Medicine

## 2023-01-27 DIAGNOSIS — I10 Essential (primary) hypertension: Secondary | ICD-10-CM

## 2023-01-28 ENCOUNTER — Other Ambulatory Visit (HOSPITAL_COMMUNITY): Payer: Self-pay

## 2023-01-28 ENCOUNTER — Other Ambulatory Visit: Payer: Self-pay

## 2023-01-28 MED ORDER — LISINOPRIL-HYDROCHLOROTHIAZIDE 20-25 MG PO TABS
1.0000 | ORAL_TABLET | Freq: Every day | ORAL | 0 refills | Status: DC
Start: 2023-01-28 — End: 2023-02-19
  Filled 2023-01-28: qty 30, 30d supply, fill #0

## 2023-02-19 ENCOUNTER — Ambulatory Visit: Payer: Commercial Managed Care - PPO | Admitting: Family Medicine

## 2023-02-19 ENCOUNTER — Encounter: Payer: Self-pay | Admitting: Family Medicine

## 2023-02-19 ENCOUNTER — Other Ambulatory Visit (HOSPITAL_COMMUNITY): Payer: Self-pay

## 2023-02-19 VITALS — BP 124/79 | HR 70 | Ht 68.0 in | Wt 272.6 lb

## 2023-02-19 DIAGNOSIS — I10 Essential (primary) hypertension: Secondary | ICD-10-CM

## 2023-02-19 DIAGNOSIS — R200 Anesthesia of skin: Secondary | ICD-10-CM | POA: Diagnosis not present

## 2023-02-19 DIAGNOSIS — R202 Paresthesia of skin: Secondary | ICD-10-CM | POA: Diagnosis not present

## 2023-02-19 DIAGNOSIS — E119 Type 2 diabetes mellitus without complications: Secondary | ICD-10-CM | POA: Diagnosis not present

## 2023-02-19 LAB — POCT GLYCOSYLATED HEMOGLOBIN (HGB A1C): HbA1c, POC (controlled diabetic range): 5.7 % (ref 0.0–7.0)

## 2023-02-19 MED ORDER — LISINOPRIL-HYDROCHLOROTHIAZIDE 20-25 MG PO TABS
1.0000 | ORAL_TABLET | Freq: Every day | ORAL | 0 refills | Status: DC
  Filled 2023-02-19 – 2023-02-25 (×2): qty 90, 90d supply, fill #0

## 2023-02-19 MED ORDER — GABAPENTIN 100 MG PO CAPS
300.0000 mg | ORAL_CAPSULE | Freq: Three times a day (TID) | ORAL | 0 refills | Status: DC | PRN
Start: 2023-02-19 — End: 2023-05-07
  Filled 2023-02-19 (×2): qty 30, 4d supply, fill #0

## 2023-02-19 NOTE — Progress Notes (Signed)
    SUBJECTIVE:   CHIEF COMPLAINT / HPI: f/u  Left arm Numbness and tingling in left arm for 3 weeks. It comes and goes. Not painful. Has had similar feelings in the past. Notes she does have sciatic pain in right leg as well. Put vics vapor rub on arm which helped mildly Nothing at work bothers her with movement. She does sleep on that side. Has improved mildly since trying to sleep on left side. Notes there was a "pressure" component that has now resolved. No slurred speech or weakness.  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  May localized to brachial plexus, however sensory distribution is nonclassic presentation given across all dermatomes up to the elbow.  Reassuringly she has no noticeable weakness and no decreased function at her work.  Suspect that this will improve with continued sleeping on the other side.  I have low suspicion for small sensory only stroke given intermittent nature and lack of other stroke symptoms however she is at risk with hypertension and diabetes.  It is likely there is also a component of cervical radiculopathy given extensive degenerative lumbar disease; however, her presentation does not fit with radiculopathic symptoms.  I performed shared decision making with the patient and explained risks, who elected to not  pursue lab work or head imaging at this time to evaluate for stroke.  If this does not improve in the next several weeks she will follow-up for next steps.  Will trial gabapentin 100 mg 3 times daily as needed.  Patient agreeable to this plan. Type 2 diabetes mellitus without complication, without long-term current use of insulin (HCC) Well controlled, A1c today 5.6. Continue Mounjaro 15mg .  Urine microalbumin creatinine ratio today. Essential hypertension Well-controlled.  Continue Zestoretic.  Return if symptoms worsen or fail to improve.  Celine Mans, MD [facility]

## 2023-02-19 NOTE — Patient Instructions (Signed)
It was great to see you! Thank you for allowing me to participate in your care!  Our plans for today:  - I have sent Gabapentin to your pharmacy to help with your numbness and tingling. - If this is not improving please return to care so we can discuss further. - Your blood pressure and diabetes are doing very well.   Please arrive 15 minutes PRIOR to your next scheduled appointment time! If you do not, this affects OTHER patients' care.  Take care and seek immediate care sooner if you develop any concerns.   Celine Mans, MD, PGY-2 [facility] 9:30 AM 02/19/2023  [facility]

## 2023-02-19 NOTE — Assessment & Plan Note (Addendum)
Well controlled, A1c today 5.6. Continue Mounjaro 15mg .  Urine microalbumin creatinine ratio today.

## 2023-02-19 NOTE — Assessment & Plan Note (Signed)
Well-controlled.  Continue Zestoretic.

## 2023-02-21 ENCOUNTER — Encounter: Payer: Self-pay | Admitting: Family Medicine

## 2023-02-21 LAB — MICROALBUMIN / CREATININE URINE RATIO
Creatinine, Urine: 103.2 mg/dL
Microalb/Creat Ratio: 9 mg/g{creat} (ref 0–29)
Microalbumin, Urine: 8.8 ug/mL

## 2023-02-25 ENCOUNTER — Other Ambulatory Visit (HOSPITAL_COMMUNITY): Payer: Self-pay

## 2023-02-28 ENCOUNTER — Other Ambulatory Visit (HOSPITAL_COMMUNITY): Payer: Self-pay

## 2023-02-28 DIAGNOSIS — H5201 Hypermetropia, right eye: Secondary | ICD-10-CM | POA: Diagnosis not present

## 2023-02-28 MED ORDER — LUMIGAN 0.01 % OP SOLN
1.0000 [drp] | Freq: Every evening | OPHTHALMIC | 4 refills | Status: AC
  Filled 2023-02-28: qty 2.5, 25d supply, fill #0
  Filled 2023-03-26: qty 7.5, 75d supply, fill #0

## 2023-02-28 MED ORDER — DORZOLAMIDE HCL-TIMOLOL MAL 2-0.5 % OP SOLN
1.0000 [drp] | Freq: Two times a day (BID) | OPHTHALMIC | 4 refills | Status: AC
  Filled 2023-02-28: qty 10, 50d supply, fill #0
  Filled 2023-03-26: qty 20, 90d supply, fill #0

## 2023-03-12 ENCOUNTER — Other Ambulatory Visit (HOSPITAL_COMMUNITY): Payer: Self-pay

## 2023-03-14 ENCOUNTER — Other Ambulatory Visit (HOSPITAL_COMMUNITY): Payer: Self-pay

## 2023-03-14 MED ORDER — HYDROCODONE-ACETAMINOPHEN 5-325 MG PO TABS
1.0000 | ORAL_TABLET | ORAL | 0 refills | Status: AC | PRN
Start: 2023-03-14 — End: ?
  Filled 2023-03-14: qty 14, 3d supply, fill #0

## 2023-03-14 MED ORDER — PENICILLIN V POTASSIUM 500 MG PO TABS
500.0000 mg | ORAL_TABLET | Freq: Four times a day (QID) | ORAL | 0 refills | Status: DC
  Filled 2023-03-14: qty 28, 7d supply, fill #0

## 2023-03-26 ENCOUNTER — Other Ambulatory Visit (HOSPITAL_COMMUNITY): Payer: Self-pay

## 2023-03-27 ENCOUNTER — Other Ambulatory Visit (HOSPITAL_COMMUNITY): Payer: Self-pay

## 2023-04-05 ENCOUNTER — Other Ambulatory Visit (HOSPITAL_COMMUNITY): Payer: Self-pay

## 2023-05-02 ENCOUNTER — Other Ambulatory Visit: Payer: Self-pay

## 2023-05-03 ENCOUNTER — Encounter: Payer: Self-pay | Admitting: Family Medicine

## 2023-05-03 DIAGNOSIS — E785 Hyperlipidemia, unspecified: Secondary | ICD-10-CM

## 2023-05-03 DIAGNOSIS — E1169 Type 2 diabetes mellitus with other specified complication: Secondary | ICD-10-CM

## 2023-05-03 DIAGNOSIS — M4310 Spondylolisthesis, site unspecified: Secondary | ICD-10-CM

## 2023-05-03 DIAGNOSIS — R200 Anesthesia of skin: Secondary | ICD-10-CM

## 2023-05-03 DIAGNOSIS — G629 Polyneuropathy, unspecified: Secondary | ICD-10-CM

## 2023-05-07 ENCOUNTER — Ambulatory Visit
Admission: RE | Admit: 2023-05-07 | Discharge: 2023-05-07 | Disposition: A | Discharge status: Home / Self Care | Source: Ambulatory Visit | Attending: Family Medicine | Admitting: Family Medicine

## 2023-05-07 ENCOUNTER — Other Ambulatory Visit (HOSPITAL_COMMUNITY): Payer: Self-pay

## 2023-05-07 ENCOUNTER — Ambulatory Visit

## 2023-05-07 VITALS — BP 127/80 | HR 93 | Ht 68.0 in | Wt 260.0 lb

## 2023-05-07 DIAGNOSIS — G8929 Other chronic pain: Secondary | ICD-10-CM

## 2023-05-07 DIAGNOSIS — M25511 Pain in right shoulder: Secondary | ICD-10-CM | POA: Diagnosis not present

## 2023-05-07 DIAGNOSIS — M25512 Pain in left shoulder: Secondary | ICD-10-CM | POA: Diagnosis not present

## 2023-05-07 DIAGNOSIS — M5031 Other cervical disc degeneration,  high cervical region: Secondary | ICD-10-CM | POA: Diagnosis not present

## 2023-05-07 MED ORDER — CYCLOBENZAPRINE HCL 5 MG PO TABS
5.0000 mg | ORAL_TABLET | Freq: Three times a day (TID) | ORAL | 1 refills | Status: DC | PRN
Start: 2023-05-07 — End: 2023-05-22
  Filled 2023-05-07: qty 30, 10d supply, fill #0

## 2023-05-07 NOTE — Progress Notes (Signed)
    SUBJECTIVE:   CHIEF COMPLAINT / HPI:   Misty Maxwell is a 57 y.o. female  presenting for arm pain.   Pain has been intermittent over several years Recently she reports aggravating the left arm 1 week ago. 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 She reports she had a friend give her a muscle relaxer and this also seemed to help her pain. History of lumbar radiculopathy.  PERTINENT  PMH / PSH: reviewed and updated.  OBJECTIVE:   BP 127/80   Pulse 93   Ht 5\' 8"  (1.727 m)   Wt 260 lb (117.9 kg)   LMP 09/29/2010   SpO2 94%   BMI 39.53 kg/m   Well-appearing, no acute distress Cardio: Regular rate, regular rhythm, no murmurs on exam. Pulm: Clear, no wheezing, no crackles. No increased work of breathing MSK: No obvious shoulder deformity Range of motion normal No tenderness to palpation on medial or lateral shoulder joint line, tenderness to palpation over trapezius muscle No pain with internal or external rotation Mildly decreased grip strength on the left compared to right Radial pulse +2 Negative Neer's, negative empty can, negative speeds  ASSESSMENT/PLAN:   Assessment & Plan Chronic left shoulder pain Origin of pain most likely from the neck or possible shoulder arthritis.  Will order x-rays of C-spine and left shoulder.  Offered formal PT patient would rather do home exercises.  Shoulder exercises provided today instructed patient to continue doing this for 5 days a week.  For pain control discussed Voltaren gel, lidocaine cream, provided short prescription of Flexeril 5 mg to be used as needed.  Could consider a course of steroids if conservative therapy does not benefit patient  barring her diabetes is well-controlled.     Glendale Chard, DO [facility] Center

## 2023-05-07 NOTE — Patient Instructions (Signed)
 It was great to see you today!   I am ordering x-rays of your neck and shoulder. Please go to [facility] on [address] to have these completed.   I have sent muscle relaxer's to your pharmacy.   Please start doing shoulder exercises 5 times per week.   For your pain you can try several over the counter topical medications. Topical medications are a great option to treat pain as you can place the medication right where you are hurting and they have less side effects.   Voltaren Gel/Diclofenac Gel: this is an anti-inflammatory cream (same ingredient as Motrin/Ibuprofen/Aleve) can be applied up to 4 times per day.     Lidocaine patch: can be applied for 12 hours and after needs a 12 hour break to continue to be effective. Works by using numbing medication to help with pain relief. Can be found at any pharmacy over the counter or on [email or web address]     No future appointments.  Please arrive 15 minutes before your appointment to ensure smooth check in process.    Please call the clinic at [phone] if your symptoms worsen or you have any concerns.  Thank you for allowing me to participate in your care, Dr. Glendale Chard [facility] Medicine

## 2023-05-08 ENCOUNTER — Other Ambulatory Visit (HOSPITAL_COMMUNITY): Payer: Self-pay

## 2023-05-08 MED ORDER — ROSUVASTATIN CALCIUM 20 MG PO TABS
20.0000 mg | ORAL_TABLET | Freq: Every day | ORAL | 3 refills | Status: AC
  Filled 2023-05-08: qty 90, 90d supply, fill #0
  Filled 2023-08-06: qty 90, 90d supply, fill #1
  Filled 2023-11-08: qty 90, 90d supply, fill #2
  Filled 2024-02-08: qty 90, 90d supply, fill #3
  Filled 2024-02-10: qty 90, 90d supply, fill #0

## 2023-05-08 NOTE — Addendum Note (Signed)
 Addended by: Celine Mans on: 05/08/2023 09:58 AM   Modules accepted: Orders

## 2023-05-09 ENCOUNTER — Other Ambulatory Visit (HOSPITAL_COMMUNITY): Payer: Self-pay

## 2023-05-10 ENCOUNTER — Encounter: Payer: Self-pay | Admitting: Student

## 2023-05-18 ENCOUNTER — Other Ambulatory Visit: Payer: Self-pay | Admitting: Family Medicine

## 2023-05-18 DIAGNOSIS — E119 Type 2 diabetes mellitus without complications: Secondary | ICD-10-CM

## 2023-05-20 ENCOUNTER — Other Ambulatory Visit (HOSPITAL_COMMUNITY): Payer: Self-pay

## 2023-05-20 MED ORDER — MOUNJARO 15 MG/0.5ML ~~LOC~~ SOAJ
15.0000 mg | SUBCUTANEOUS | 1 refills | Status: DC
  Filled 2023-05-20: qty 4, 56d supply, fill #0
  Filled 2023-07-12: qty 4, 56d supply, fill #1

## 2023-05-22 ENCOUNTER — Other Ambulatory Visit (HOSPITAL_COMMUNITY): Payer: Self-pay

## 2023-05-22 MED ORDER — TIZANIDINE HCL 4 MG PO TABS
4.0000 mg | ORAL_TABLET | Freq: Four times a day (QID) | ORAL | 0 refills | Status: DC | PRN
Start: 2023-05-22 — End: 2023-09-26
  Filled 2023-05-22: qty 30, 8d supply, fill #0

## 2023-05-22 NOTE — Addendum Note (Signed)
 Addended by: Ivin Marrow on: 05/22/2023 08:22 AM   Modules accepted: Orders

## 2023-05-24 ENCOUNTER — Other Ambulatory Visit: Payer: Self-pay | Admitting: Family Medicine

## 2023-05-24 DIAGNOSIS — I10 Essential (primary) hypertension: Secondary | ICD-10-CM

## 2023-05-27 ENCOUNTER — Other Ambulatory Visit (HOSPITAL_COMMUNITY): Payer: Self-pay

## 2023-05-27 MED ORDER — LISINOPRIL-HYDROCHLOROTHIAZIDE 20-25 MG PO TABS
1.0000 | ORAL_TABLET | Freq: Every day | ORAL | 0 refills | Status: DC
  Filled 2023-05-27: qty 90, 90d supply, fill #0

## 2023-06-03 ENCOUNTER — Ambulatory Visit (INDEPENDENT_AMBULATORY_CARE_PROVIDER_SITE_OTHER): Admitting: Family Medicine

## 2023-06-03 ENCOUNTER — Other Ambulatory Visit (HOSPITAL_COMMUNITY): Payer: Self-pay

## 2023-06-03 ENCOUNTER — Encounter: Payer: Self-pay | Admitting: Family Medicine

## 2023-06-03 VITALS — BP 126/77 | HR 93 | Ht 68.0 in | Wt 264.4 lb

## 2023-06-03 DIAGNOSIS — R200 Anesthesia of skin: Secondary | ICD-10-CM | POA: Insufficient documentation

## 2023-06-03 DIAGNOSIS — R202 Paresthesia of skin: Secondary | ICD-10-CM | POA: Diagnosis not present

## 2023-06-03 DIAGNOSIS — M7712 Lateral epicondylitis, left elbow: Secondary | ICD-10-CM

## 2023-06-03 MED ORDER — NAPROXEN 500 MG PO TABS
500.0000 mg | ORAL_TABLET | Freq: Two times a day (BID) | ORAL | 0 refills | Status: DC
Start: 2023-06-03 — End: 2023-09-26
  Filled 2023-06-03: qty 30, 15d supply, fill #0

## 2023-06-03 MED ORDER — GABAPENTIN 100 MG PO CAPS
100.0000 mg | ORAL_CAPSULE | Freq: Three times a day (TID) | ORAL | 3 refills | Status: DC
Start: 2023-06-03 — End: 2023-06-22
  Filled 2023-06-03: qty 90, 30d supply, fill #0

## 2023-06-03 NOTE — Patient Instructions (Addendum)
 It was great to see you! Thank you for allowing me to participate in your care!  Our plans for today:  - I want you to rest in the next 2 weeks as much as possible, avoid movements that hurt your arm. - Please take Naprosyn  500mg  twice daily for the next 2 weeks. - Please start taking Gabapentin  100mg  TID as needed. - You may wear an elbow sleeve if you feel like that helps, these are available on [employer] or at [facility] or [facility].    Please arrive 15 minutes PRIOR to your next scheduled appointment time! If you do not, this affects OTHER patients' care.  Take care and seek immediate care sooner if you develop any concerns.   Ivin Marrow, MD, PGY-2 [facility] Medicine 3:42 PM 06/03/2023  [facility]

## 2023-06-03 NOTE — Progress Notes (Signed)
    SUBJECTIVE:   CHIEF COMPLAINT / HPI: shoulder pain  Has been having continued numbness and tingling down arm. BC arthritis helped with pain. Lidocaine  and menthol  cream has helped. Pushing cart aggravates arm.  Has been doing exercises as directed by Dr. Annabell Key. Pain is on lateral elbow. Pain does sometimes radiate down from neck.  Elbow pain is worse with exercise and elbow joint. Works in [employer]. Has tried tumeric.  PERTINENT  PMH / PSH: HTN, HLD, T2DM  OBJECTIVE:   BP 126/77   Pulse 93   Ht 5\' 8"  (1.727 m)   Wt 264 lb 6 oz (119.9 kg)   LMP 09/29/2010   SpO2 100%   BMI 40.20 kg/m   General: NAD, well appearing Neuro: A&O Respiratory: normal WOB on RA Extremities: Moving all 4 extremities equally Left elbow: no obvious erythema or swelling, moderately tender to palpation over lateral epicondyle and forearm extensors, full ROM, pain with resisted wrist extension Left shoulder: full ROM with out pain, no TTP Neck: full ROM, non tender trapezius and paraspinal muscles Left hand: numbness of all fingers  ASSESSMENT/PLAN:   Assessment & Plan Lateral epicondylitis of left elbow Exam consistent today with lateral epicondylitis likely in the setting of overuse due to work. - Start 2 weeks dedicated avoid exacerbation of movements that hurt patient's arm - Start Naprosyn  500 mg twice daily for 2 weeks - Recommended to buy elbow compression sleeve to wear at work - Follow-up 2 weeks to reevaluate for initiation of physical therapy. Numbness and tingling in left hand Chronic numbness and tingling in left hand with radiculopathic signs as it does shoot down from her neck/trap.  However recent imaging without signs of degenerative cervical disease.  Numbness is not in peripheral nerve distribution.  This may be result of cervical or trapezius paraspinal muscle nerve root compression however would be atypical to have full C6-C8 distribution.  I have low suspicion for central  neurologic cause as she has had no weakness or dysarthria any point. Hopefully will partial improve with treatment of lateral epicondylitis as well. - Will trial Gabapentin  100mg  TID - Would consider referral for EMG, if not improving at next visit  Return in about 2 weeks (around 06/17/2023).  Misty Marrow, MD [facility]

## 2023-06-03 NOTE — Assessment & Plan Note (Signed)
 Chronic numbness and tingling in left hand with radiculopathic signs as it does shoot down from her neck/trap.  However recent imaging without signs of degenerative cervical disease.  Numbness is not in peripheral nerve distribution.  This may be result of cervical or trapezius paraspinal muscle nerve root compression however would be atypical to have full C6-C8 distribution.  I have low suspicion for central neurologic cause as she has had no weakness or dysarthria any point. Hopefully will partial improve with treatment of lateral epicondylitis as well. - Will trial Gabapentin  100mg  TID - Would consider referral for EMG, if not improving at next visit

## 2023-06-19 DIAGNOSIS — M5412 Radiculopathy, cervical region: Secondary | ICD-10-CM | POA: Diagnosis not present

## 2023-06-20 ENCOUNTER — Other Ambulatory Visit: Payer: Self-pay | Admitting: Neurosurgery

## 2023-06-20 DIAGNOSIS — M5412 Radiculopathy, cervical region: Secondary | ICD-10-CM

## 2023-06-21 ENCOUNTER — Telehealth: Payer: Self-pay

## 2023-06-21 NOTE — Telephone Encounter (Signed)
 Patient calls nurse line requesting an increase in Gabapentin .   She reports she has been taking 100mg  TID along with Tylenol  and BC power with minimal relief.   She reports the Tizanidine  works well during the night, however she can not take during the day due to drowsiness.   She reports she has a MRI ordered, however is waiting for insurance approval through Dr. Bettie Bruce office.  Advised will forward to PCP for an increase in Gabapentin .

## 2023-06-22 ENCOUNTER — Other Ambulatory Visit (HOSPITAL_COMMUNITY): Payer: Self-pay

## 2023-06-22 MED ORDER — GABAPENTIN 100 MG PO CAPS
200.0000 mg | ORAL_CAPSULE | Freq: Three times a day (TID) | ORAL | 3 refills | Status: AC
  Filled 2023-06-22 – 2023-06-27 (×2): qty 180, 30d supply, fill #0
  Filled 2023-07-27: qty 180, 30d supply, fill #1
  Filled 2023-09-04: qty 180, 30d supply, fill #2

## 2023-06-22 NOTE — Addendum Note (Signed)
 Addended by: Ivin Marrow on: 06/22/2023 07:00 PM   Modules accepted: Orders

## 2023-06-23 ENCOUNTER — Other Ambulatory Visit: Payer: Self-pay

## 2023-06-25 ENCOUNTER — Other Ambulatory Visit (HOSPITAL_COMMUNITY): Payer: Self-pay

## 2023-06-25 ENCOUNTER — Ambulatory Visit
Admission: RE | Admit: 2023-06-25 | Discharge: 2023-06-25 | Disposition: A | Discharge status: Home / Self Care | Source: Ambulatory Visit | Attending: Neurosurgery | Admitting: Neurosurgery

## 2023-06-25 DIAGNOSIS — M5412 Radiculopathy, cervical region: Secondary | ICD-10-CM

## 2023-06-25 DIAGNOSIS — M4802 Spinal stenosis, cervical region: Secondary | ICD-10-CM | POA: Diagnosis not present

## 2023-06-27 ENCOUNTER — Other Ambulatory Visit (HOSPITAL_COMMUNITY): Payer: Self-pay

## 2023-06-30 ENCOUNTER — Other Ambulatory Visit: Payer: Self-pay | Admitting: Family Medicine

## 2023-06-30 DIAGNOSIS — M4310 Spondylolisthesis, site unspecified: Secondary | ICD-10-CM

## 2023-07-01 ENCOUNTER — Encounter (HOSPITAL_COMMUNITY): Payer: Self-pay

## 2023-07-01 ENCOUNTER — Other Ambulatory Visit (HOSPITAL_COMMUNITY): Payer: Self-pay

## 2023-07-12 ENCOUNTER — Other Ambulatory Visit: Payer: Self-pay

## 2023-07-12 ENCOUNTER — Other Ambulatory Visit: Payer: Self-pay | Admitting: Family Medicine

## 2023-07-12 DIAGNOSIS — M7712 Lateral epicondylitis, left elbow: Secondary | ICD-10-CM

## 2023-07-15 ENCOUNTER — Encounter (HOSPITAL_COMMUNITY): Payer: Self-pay

## 2023-07-15 ENCOUNTER — Other Ambulatory Visit (HOSPITAL_COMMUNITY): Payer: Self-pay

## 2023-07-29 ENCOUNTER — Encounter: Payer: Self-pay | Admitting: Family Medicine

## 2023-07-30 ENCOUNTER — Ambulatory Visit: Admitting: Student

## 2023-07-30 VITALS — BP 113/60 | HR 76 | Wt 268.8 lb

## 2023-07-30 DIAGNOSIS — R202 Paresthesia of skin: Secondary | ICD-10-CM | POA: Diagnosis not present

## 2023-07-30 DIAGNOSIS — R200 Anesthesia of skin: Secondary | ICD-10-CM | POA: Diagnosis not present

## 2023-07-30 NOTE — Patient Instructions (Addendum)
 It was great to see you! Thank you for allowing me to participate in your care!  I recommend that you always bring your medications to each appointment as this makes it easy to ensure we are on the correct medications and helps us  not miss when refills are needed.  Our plans for today:  - Arm/Shoulder pain tingling  We are going to collect some blood work to see how your kidneys and liver are doing.    Come back at your convenience for blood work (schedule appointment for lab visit)   If blood work is okay, we will increase your dose of gabapentin    Pain regimen Stop taking BC powder! This is bad for your kidneys and liver   Ibuprofen : no more than 3,200 mg a day (800 mg every 8 hours)   Tylenol : No more than 3,000 mg a day (1000 mg every 8 hours)   Continue Gabapentin : Will likely increase depending on kidney function   Take care and seek immediate care sooner if you develop any concerns.   Dr. Penne Rhein, MD [facility] Medicine

## 2023-07-30 NOTE — Progress Notes (Signed)
  SUBJECTIVE:   CHIEF COMPLAINT / HPI:   Left hand issues -Seen 5/5 for left hand tingling and weakness, sent to Neuro surgery where she got  -MRI showing severe right C3-C5 foraminal cervical stenosis / Severe left C6-C7 foraminal stenosis, mild spinal canal stenosis C3-C5  Today: Still continues to have pain and tingling shooting down arm, but has good range of motion and no pain with stretching exercises.    PERTINENT  PMH / PSH:   OBJECTIVE:  BP 113/60   Pulse 76   Wt 268 lb 12.8 oz (121.9 kg)   LMP 09/29/2010   SpO2 100%   BMI 40.87 kg/m  Physical Exam  Musculoskeletal:     Left shoulder: Tenderness present. No swelling, deformity, effusion, laceration, bony tenderness or crepitus. Normal range of motion. Normal strength.     Left upper arm: No swelling, edema, deformity, lacerations, tenderness or bony tenderness.     Left forearm: No swelling, edema, lacerations, tenderness or bony tenderness.     Left hand: No swelling, deformity, lacerations, tenderness or bony tenderness. Normal range of motion. Normal strength.     Comments: Pins and needles sensation at finger tips      ASSESSMENT/PLAN:   Assessment & Plan Numbness and tingling in left hand Patient comes in for follow-up of her continued numbness and tingling sensation.  Patient Appreciates Good Range Of Motion, No Pain with Exercise or Activity.  But Does Have Shooting Numbness and Tingling Pain at Rest.  Patient appreciates she is taking ibuprofen , BC powder, Tylenol , muscle relaxer, gabapentin  with little relief, but appreciates gabapentin  and muscle relaxer seem to help the most.  Will recommend patient stops taking BC powder as she is taking 2000 to 3000 mg a day plus ibuprofen  and tylenol .  Will check kidney and liver function, and if all is well can increase dose of gabapentin . - CMP - Consider gabapentin  300 mg 3 times daily if renal fxn good - Pain reg: 1000 mg Tylenol  TID, 800 mg ibuprofen  TID No  follow-ups on file. Penne Rhein, MD 07/30/2023, 4:35 PM PGY-3, [facility]

## 2023-07-30 NOTE — Assessment & Plan Note (Addendum)
 Patient comes in for follow-up of her continued numbness and tingling sensation.  Patient Appreciates Good Range Of Motion, No Pain with Exercise or Activity.  But Does Have Shooting Numbness and Tingling Pain at Rest.  Patient appreciates she is taking ibuprofen , BC powder, Tylenol , muscle relaxer, gabapentin  with little relief, but appreciates gabapentin  and muscle relaxer seem to help the most.  Will recommend patient stops taking BC powder as she is taking 2000 to 3000 mg a day plus ibuprofen  and tylenol .  Will check kidney and liver function, and if all is well can increase dose of gabapentin . - CMP - Consider gabapentin  300 mg 3 times daily if renal fxn good - Pain reg: 1000 mg Tylenol  TID, 800 mg ibuprofen  TID

## 2023-08-01 ENCOUNTER — Other Ambulatory Visit

## 2023-08-01 DIAGNOSIS — R200 Anesthesia of skin: Secondary | ICD-10-CM

## 2023-08-01 DIAGNOSIS — R202 Paresthesia of skin: Secondary | ICD-10-CM | POA: Diagnosis not present

## 2023-08-02 LAB — COMPREHENSIVE METABOLIC PANEL WITH GFR
ALT: 30 IU/L (ref 0–32)
AST: 43 IU/L — ABNORMAL HIGH (ref 0–40)
Albumin: 4 g/dL (ref 3.8–4.9)
Alkaline Phosphatase: 86 IU/L (ref 44–121)
BUN/Creatinine Ratio: 25 — ABNORMAL HIGH (ref 9–23)
BUN: 20 mg/dL (ref 6–24)
Bilirubin Total: 0.3 mg/dL (ref 0.0–1.2)
CO2: 20 mmol/L (ref 20–29)
Calcium: 9.5 mg/dL (ref 8.7–10.2)
Chloride: 106 mmol/L (ref 96–106)
Creatinine, Ser: 0.8 mg/dL (ref 0.57–1.00)
Globulin, Total: 3.1 g/dL (ref 1.5–4.5)
Glucose: 97 mg/dL (ref 70–99)
Potassium: 3.6 mmol/L (ref 3.5–5.2)
Sodium: 143 mmol/L (ref 134–144)
Total Protein: 7.1 g/dL (ref 6.0–8.5)
eGFR: 85 mL/min/1.73 (ref >59)

## 2023-08-05 ENCOUNTER — Ambulatory Visit: Payer: Self-pay | Admitting: Family Medicine

## 2023-08-14 ENCOUNTER — Other Ambulatory Visit (HOSPITAL_COMMUNITY): Payer: Self-pay

## 2023-08-14 DIAGNOSIS — M5412 Radiculopathy, cervical region: Secondary | ICD-10-CM | POA: Diagnosis not present

## 2023-08-14 MED ORDER — HYDROCODONE-ACETAMINOPHEN 5-325 MG PO TABS
1.0000 | ORAL_TABLET | Freq: Four times a day (QID) | ORAL | 0 refills | Status: DC | PRN
Start: 2023-08-14 — End: 2023-09-16
  Filled 2023-08-14 (×2): qty 40, 10d supply, fill #0

## 2023-09-03 DIAGNOSIS — H401131 Primary open-angle glaucoma, bilateral, mild stage: Secondary | ICD-10-CM | POA: Diagnosis not present

## 2023-09-03 DIAGNOSIS — E119 Type 2 diabetes mellitus without complications: Secondary | ICD-10-CM | POA: Diagnosis not present

## 2023-09-04 ENCOUNTER — Other Ambulatory Visit: Payer: Self-pay

## 2023-09-04 ENCOUNTER — Other Ambulatory Visit: Payer: Self-pay | Admitting: Family Medicine

## 2023-09-04 ENCOUNTER — Encounter: Payer: Self-pay | Admitting: Family Medicine

## 2023-09-04 DIAGNOSIS — I10 Essential (primary) hypertension: Secondary | ICD-10-CM

## 2023-09-05 ENCOUNTER — Other Ambulatory Visit (HOSPITAL_COMMUNITY): Payer: Self-pay

## 2023-09-05 ENCOUNTER — Other Ambulatory Visit: Payer: Self-pay

## 2023-09-05 MED ORDER — LISINOPRIL-HYDROCHLOROTHIAZIDE 20-25 MG PO TABS
1.0000 | ORAL_TABLET | Freq: Every day | ORAL | 0 refills | Status: DC
  Filled 2023-09-05: qty 90, 90d supply, fill #0

## 2023-09-16 ENCOUNTER — Other Ambulatory Visit (HOSPITAL_COMMUNITY): Payer: Self-pay

## 2023-09-16 DIAGNOSIS — M4722 Other spondylosis with radiculopathy, cervical region: Secondary | ICD-10-CM | POA: Diagnosis not present

## 2023-09-16 DIAGNOSIS — M50123 Cervical disc disorder at C6-C7 level with radiculopathy: Secondary | ICD-10-CM | POA: Diagnosis not present

## 2023-09-16 DIAGNOSIS — M5412 Radiculopathy, cervical region: Secondary | ICD-10-CM | POA: Diagnosis not present

## 2023-09-16 DIAGNOSIS — M4802 Spinal stenosis, cervical region: Secondary | ICD-10-CM | POA: Diagnosis not present

## 2023-09-16 MED ORDER — CYCLOBENZAPRINE HCL 10 MG PO TABS
10.0000 mg | ORAL_TABLET | Freq: Three times a day (TID) | ORAL | 0 refills | Status: AC | PRN
  Filled 2023-09-16: qty 30, 10d supply, fill #0

## 2023-09-16 MED ORDER — HYDROCODONE-ACETAMINOPHEN 5-325 MG PO TABS
1.0000 | ORAL_TABLET | ORAL | 0 refills | Status: AC | PRN
Start: 2023-09-16 — End: ?
  Filled 2023-09-16: qty 42, 7d supply, fill #0

## 2023-09-17 ENCOUNTER — Other Ambulatory Visit: Payer: Self-pay | Admitting: Family Medicine

## 2023-09-17 DIAGNOSIS — E119 Type 2 diabetes mellitus without complications: Secondary | ICD-10-CM

## 2023-09-17 MED ORDER — MOUNJARO 15 MG/0.5ML ~~LOC~~ SOAJ
15.0000 mg | SUBCUTANEOUS | 1 refills | Status: DC
  Filled 2023-09-17: qty 4, 56d supply, fill #0
  Filled 2023-11-01 – 2023-11-04 (×2): qty 4, 56d supply, fill #1

## 2023-09-18 ENCOUNTER — Other Ambulatory Visit (HOSPITAL_COMMUNITY): Payer: Self-pay

## 2023-09-23 ENCOUNTER — Other Ambulatory Visit (HOSPITAL_COMMUNITY): Payer: Self-pay

## 2023-09-23 MED ORDER — METHYLPREDNISOLONE 4 MG PO TBPK
ORAL_TABLET | ORAL | 0 refills | Status: AC
  Filled 2023-09-23: qty 21, 6d supply, fill #0

## 2023-09-25 NOTE — Telephone Encounter (Signed)
 Called patient. Scheduled for tomorrow morning with Dr. Nicholas.  [name] English, RN

## 2023-09-26 ENCOUNTER — Ambulatory Visit (INDEPENDENT_AMBULATORY_CARE_PROVIDER_SITE_OTHER): Admitting: Family Medicine

## 2023-09-26 ENCOUNTER — Encounter: Payer: Self-pay | Admitting: Family Medicine

## 2023-09-26 VITALS — BP 107/71 | HR 99 | Ht 68.0 in | Wt 260.4 lb

## 2023-09-26 DIAGNOSIS — E1169 Type 2 diabetes mellitus with other specified complication: Secondary | ICD-10-CM

## 2023-09-26 DIAGNOSIS — Z0000 Encounter for general adult medical examination without abnormal findings: Secondary | ICD-10-CM | POA: Diagnosis not present

## 2023-09-26 DIAGNOSIS — E785 Hyperlipidemia, unspecified: Secondary | ICD-10-CM

## 2023-09-26 DIAGNOSIS — Z23 Encounter for immunization: Secondary | ICD-10-CM

## 2023-09-26 DIAGNOSIS — E119 Type 2 diabetes mellitus without complications: Secondary | ICD-10-CM | POA: Diagnosis not present

## 2023-09-26 DIAGNOSIS — R29898 Other symptoms and signs involving the musculoskeletal system: Secondary | ICD-10-CM | POA: Diagnosis not present

## 2023-09-26 LAB — POCT GLYCOSYLATED HEMOGLOBIN (HGB A1C): HbA1c, POC (controlled diabetic range): 5.6 % (ref 0.0–7.0)

## 2023-09-26 NOTE — Patient Instructions (Signed)
 It was wonderful to see you today.  Please bring ALL of your medications with you to every visit.   Today we talked about:  Annual physical - You are doing great! We are going to check your cholesterol today. Your A1c went from 5.7 to 5.6 keep up the good work! I will talk to Dr. Alba about referring you to physical therapy once you recover from surgery.   Please follow up in 1 year or sooner if needed   Thank you for choosing [facility].   Please call [phone] with any questions about today's appointment.  Areta Saliva, MD  Family Medicine
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
## 2023-09-27 LAB — LIPID PANEL
Chol/HDL Ratio: 2.1 ratio (ref 0.0–4.4)
Cholesterol, Total: 183 mg/dL (ref 100–199)
HDL: 89 mg/dL (ref >39)
LDL Chol Calc (NIH): 85 mg/dL (ref 0–99)
Triglycerides: 47 mg/dL (ref 0–149)
VLDL Cholesterol Cal: 9 mg/dL (ref 5–40)

## 2023-09-27 NOTE — Assessment & Plan Note (Signed)
 Well controlled in the past.  - Continue rosuvastatin   - Lipid panel today

## 2023-09-27 NOTE — Assessment & Plan Note (Signed)
 Well controlled. A1c from 5.7 seven months ago to 5.6 today.  - Continue mounjaro  and lifestyle changes

## 2023-10-01 ENCOUNTER — Ambulatory Visit: Payer: Self-pay | Admitting: Family Medicine

## 2023-10-17 DIAGNOSIS — M5412 Radiculopathy, cervical region: Secondary | ICD-10-CM | POA: Diagnosis not present

## 2023-11-01 ENCOUNTER — Other Ambulatory Visit (HOSPITAL_COMMUNITY): Payer: Self-pay

## 2023-11-04 ENCOUNTER — Other Ambulatory Visit (HOSPITAL_COMMUNITY): Payer: Self-pay

## 2023-11-20 DIAGNOSIS — M5412 Radiculopathy, cervical region: Secondary | ICD-10-CM | POA: Diagnosis not present

## 2023-11-28 ENCOUNTER — Other Ambulatory Visit: Payer: Self-pay | Admitting: Family Medicine

## 2023-11-28 ENCOUNTER — Other Ambulatory Visit (HOSPITAL_COMMUNITY): Payer: Self-pay

## 2023-11-28 DIAGNOSIS — I10 Essential (primary) hypertension: Secondary | ICD-10-CM

## 2023-11-28 MED ORDER — LISINOPRIL-HYDROCHLOROTHIAZIDE 20-25 MG PO TABS
1.0000 | ORAL_TABLET | Freq: Every day | ORAL | 0 refills | Status: DC
  Filled 2023-11-28: qty 90, 90d supply, fill #0

## 2024-01-10 ENCOUNTER — Other Ambulatory Visit (HOSPITAL_COMMUNITY): Payer: Self-pay

## 2024-01-10 ENCOUNTER — Other Ambulatory Visit: Payer: Self-pay | Admitting: Family Medicine

## 2024-01-10 DIAGNOSIS — E119 Type 2 diabetes mellitus without complications: Secondary | ICD-10-CM

## 2024-01-10 MED ORDER — MOUNJARO 15 MG/0.5ML ~~LOC~~ SOAJ
15.0000 mg | SUBCUTANEOUS | 1 refills | Status: AC
  Filled 2024-01-10: qty 4, 56d supply, fill #0
  Filled 2024-03-05: qty 4, 56d supply, fill #1

## 2024-02-08 ENCOUNTER — Other Ambulatory Visit: Payer: Self-pay | Admitting: Family Medicine

## 2024-02-08 DIAGNOSIS — I10 Essential (primary) hypertension: Secondary | ICD-10-CM

## 2024-02-09 ENCOUNTER — Other Ambulatory Visit: Payer: Self-pay

## 2024-02-10 ENCOUNTER — Encounter (HOSPITAL_COMMUNITY): Payer: Self-pay | Admitting: Pharmacist

## 2024-02-10 ENCOUNTER — Other Ambulatory Visit (HOSPITAL_COMMUNITY): Payer: Self-pay

## 2024-02-10 ENCOUNTER — Other Ambulatory Visit: Payer: Self-pay

## 2024-02-10 MED ORDER — LISINOPRIL-HYDROCHLOROTHIAZIDE 20-25 MG PO TABS
1.0000 | ORAL_TABLET | Freq: Every day | ORAL | 0 refills | Status: AC
  Filled 2024-02-10 (×2): qty 90, 90d supply, fill #0

## 2024-03-05 ENCOUNTER — Other Ambulatory Visit (HOSPITAL_COMMUNITY): Payer: Self-pay
